# Patient Record
Sex: Male | Born: 1955 | Race: Black or African American | Hispanic: No | Marital: Single | Smoking: Current every day smoker
Health system: Southern US, Community
[De-identification: ages and names within clinical notes are randomized; demographics above are authoritative.]

## PROBLEM LIST (undated history)

## (undated) DIAGNOSIS — L409 Psoriasis, unspecified: Secondary | ICD-10-CM

## (undated) DIAGNOSIS — I10 Essential (primary) hypertension: Secondary | ICD-10-CM

## (undated) DIAGNOSIS — Z720 Tobacco use: Secondary | ICD-10-CM

## (undated) DIAGNOSIS — J449 Chronic obstructive pulmonary disease, unspecified: Secondary | ICD-10-CM

## (undated) DIAGNOSIS — G8929 Other chronic pain: Secondary | ICD-10-CM

## (undated) HISTORY — DX: Tobacco use: Z72.0

## (undated) HISTORY — DX: Other chronic pain: G89.29

## (undated) HISTORY — DX: Essential (primary) hypertension: I10

## (undated) HISTORY — DX: Chronic obstructive pulmonary disease, unspecified: J44.9

---

## 1998-01-17 ENCOUNTER — Encounter: Admission: RE | Admit: 1998-01-17 | Discharge: 1998-01-17 | Payer: Self-pay | Admitting: Family Medicine

## 1998-02-05 ENCOUNTER — Emergency Department (HOSPITAL_COMMUNITY): Admission: EM | Admit: 1998-02-05 | Discharge: 1998-02-05 | Payer: Self-pay | Admitting: Emergency Medicine

## 2004-02-01 ENCOUNTER — Emergency Department (HOSPITAL_COMMUNITY): Admission: EM | Admit: 2004-02-01 | Discharge: 2004-02-01 | Payer: Self-pay | Admitting: Family Medicine

## 2011-04-24 ENCOUNTER — Emergency Department (INDEPENDENT_AMBULATORY_CARE_PROVIDER_SITE_OTHER)
Admission: EM | Admit: 2011-04-24 | Discharge: 2011-04-24 | Disposition: A | Discharge status: Home / Self Care | Payer: Self-pay | Source: Home / Self Care | Attending: Family Medicine | Admitting: Family Medicine

## 2011-04-24 ENCOUNTER — Encounter (HOSPITAL_COMMUNITY): Payer: Self-pay | Admitting: Emergency Medicine

## 2011-04-24 DIAGNOSIS — K0510 Chronic gingivitis, plaque induced: Secondary | ICD-10-CM

## 2011-04-24 MED ORDER — DICLOFENAC POTASSIUM 50 MG PO TABS: 50.0000 mg | ORAL_TABLET | Freq: Three times a day (TID) | ORAL | Status: AC

## 2011-04-24 MED ORDER — CLINDAMYCIN HCL 150 MG PO CAPS: 150.0000 mg | ORAL_CAPSULE | Freq: Four times a day (QID) | ORAL | Status: AC

## 2011-04-24 NOTE — ED Provider Notes (Signed)
History     CSN: [id number]  Arrival date & time 04/24/11  4098   First MD Initiated Contact with Patient 04/24/11 0820      Chief Complaint  Patient presents with  . Dental Pain    (Consider location/radiation/quality/duration/timing/severity/associated sxs/prior treatment) Patient is a 56 y.o. male presenting with tooth pain. The history is provided by the patient.  Dental PainThe primary symptoms include mouth pain and dental injury. The symptoms began more than 1 week ago (worse last eve.). The symptoms are worsening. The symptoms occur constantly.  Additional symptoms include: gum swelling, gum tenderness and purulent gums.    History reviewed. No pertinent past medical history.  History reviewed. No pertinent past surgical history.  No family history on file.  History  Substance Use Topics  . Smoking status: Current Everyday Smoker  . Smokeless tobacco: Not on file  . Alcohol Use: No      Review of Systems  Constitutional: Negative.   HENT: Positive for dental problem.     Allergies  Review of patient's allergies indicates no known allergies.  Home Medications   Current Outpatient Rx  Name Route Sig Dispense Refill  . IBUPROFEN 200 MG PO TABS Oral Take 400 mg by mouth every 6 (six) hours as needed.    Marland Kitchen CLINDAMYCIN HCL 150 MG PO CAPS Oral Take 1 capsule (150 mg total) by mouth every 6 (six) hours. 28 capsule 0  . DICLOFENAC POTASSIUM 50 MG PO TABS Oral Take 1 tablet (50 mg total) by mouth 3 (three) times daily. 15 tablet 0    BP 150/70  Pulse 74  Temp(Src) 98.2 F (36.8 C) (Oral)  Resp 18  SpO2 99%  Physical Exam  Nursing note and vitals reviewed. Constitutional: He appears well-developed and well-nourished.  HENT:  Head: Normocephalic.  Right Ear: External ear normal.  Left Ear: External ear normal.  Mouth/Throat:      ED Course  Procedures (including critical care time)  Labs Reviewed - No data to display No results found.   1.  Gingivitis, chronic, plaque induced       MDM          Linna Hoff, MD 04/24/11 [phone]

## 2011-04-24 NOTE — Discharge Instructions (Signed)
Take medicine as prescribed, see your dentist as soon as possible °

## 2011-04-24 NOTE — ED Notes (Signed)
Reports top, left tooth is hurting, reports tooth is broken.  History of this tooth hurting in the past.

## 2014-08-16 ENCOUNTER — Emergency Department (HOSPITAL_COMMUNITY)
Admission: EM | Admit: 2014-08-16 | Discharge: 2014-08-16 | Disposition: A | Discharge status: Home / Self Care | Payer: Medicaid Other | Attending: Emergency Medicine | Admitting: Emergency Medicine

## 2014-08-16 ENCOUNTER — Encounter (HOSPITAL_COMMUNITY): Payer: Self-pay | Admitting: Emergency Medicine

## 2014-08-16 ENCOUNTER — Emergency Department (HOSPITAL_COMMUNITY): Payer: Medicaid Other

## 2014-08-16 DIAGNOSIS — Z720 Tobacco use: Secondary | ICD-10-CM | POA: Insufficient documentation

## 2014-08-16 DIAGNOSIS — Y9389 Activity, other specified: Secondary | ICD-10-CM | POA: Diagnosis not present

## 2014-08-16 DIAGNOSIS — S4991XA Unspecified injury of right shoulder and upper arm, initial encounter: Secondary | ICD-10-CM | POA: Insufficient documentation

## 2014-08-16 DIAGNOSIS — Y9289 Other specified places as the place of occurrence of the external cause: Secondary | ICD-10-CM | POA: Diagnosis not present

## 2014-08-16 DIAGNOSIS — S20211A Contusion of right front wall of thorax, initial encounter: Secondary | ICD-10-CM | POA: Diagnosis not present

## 2014-08-16 DIAGNOSIS — W208XXA Other cause of strike by thrown, projected or falling object, initial encounter: Secondary | ICD-10-CM | POA: Diagnosis not present

## 2014-08-16 DIAGNOSIS — Y998 Other external cause status: Secondary | ICD-10-CM | POA: Insufficient documentation

## 2014-08-16 DIAGNOSIS — S299XXA Unspecified injury of thorax, initial encounter: Secondary | ICD-10-CM | POA: Diagnosis present

## 2014-08-16 MED ORDER — TRAMADOL HCL 50 MG PO TABS: 50.0000 mg | ORAL_TABLET | Freq: Four times a day (QID) | ORAL | Status: DC | PRN

## 2014-08-16 MED ORDER — NAPROXEN 375 MG PO TABS: 375.0000 mg | ORAL_TABLET | Freq: Two times a day (BID) | ORAL | Status: DC

## 2014-08-16 NOTE — ED Notes (Signed)
Pt reports he was struck by tree limb two days ago when trying to put it in a wood chipper.  Pt c/o right arm and chest pain.

## 2014-08-16 NOTE — ED Provider Notes (Addendum)
CSN: [id number]     Arrival date & time 08/16/14  5621 History   First MD Initiated Contact with Patient 08/16/14 [phone]     Chief Complaint  Patient presents with  . Chest Pain    hit by a tree  . Arm Pain     (Consider location/radiation/quality/duration/timing/severity/associated sxs/prior Treatment) HPI Comments: Patient presents with right-sided chest pain and right shoulder pain. 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 He's had constant pain in his right chest and right shoulder since that time. He states it hurts to try to sit up or to move his arm. He denies any numbness or weakness in his arm. He denies any shortness of breath. He does say it hurts to breathe. He denies abdominal pain. There is no nausea or vomiting. He denies any other injuries. He's been using ibuprofen without relief.   History reviewed. No pertinent past medical history. History reviewed. No pertinent past surgical history. No family history on file. History  Substance Use Topics  . Smoking status: Current Every Day Smoker  . Smokeless tobacco: Not on file  . Alcohol Use: No    Review of Systems  Constitutional: Negative for fever.  Respiratory: Negative for shortness of breath.   Cardiovascular: Positive for chest pain.  Gastrointestinal: Negative for nausea and vomiting.  Musculoskeletal: Positive for arthralgias. Negative for back pain, joint swelling and neck pain.  Skin: Negative for wound.  Neurological: Negative for weakness, numbness and headaches.      Allergies  Review of patient's allergies indicates no known allergies.  Home Medications   Prior to Admission medications   Medication Sig Start Date End Date Taking? Authorizing Provider  ibuprofen (ADVIL,MOTRIN) 200 MG tablet Take 400 mg by mouth every 6 (six) hours as needed.    Historical Provider, MD  naproxen (NAPROSYN) 375 MG tablet Take 1 tablet  (375 mg total) by mouth 2 (two) times daily. 08/16/14   Rolan Bucco, MD  traMADol (ULTRAM) 50 MG tablet Take 1 tablet (50 mg total) by mouth every 6 (six) hours as needed. 08/16/14   Rolan Bucco, MD   BP 140/72 mmHg  Pulse 85  Temp(Src) 98.1 F (36.7 C) (Oral)  Resp 12  Ht  (1.626 m)  Wt 199 lb 1 oz (90.294 kg)  BMI 34.15 kg/m2  SpO2 99% Physical Exam  Constitutional: He is oriented to person, place, and time. He appears well-developed and well-nourished.  HENT:  Head: Normocephalic and atraumatic.  Neck: Normal range of motion. Neck supple.  Cardiovascular: Normal rate.   Pulmonary/Chest: Effort normal. He exhibits tenderness.  Positive tenderness in the right upper chest wall. There is no crepitus or deformity. No signs of external trauma  Musculoskeletal: He exhibits tenderness. He exhibits no edema.  Positive tenderness on range of motion of the right shoulder. There is tenderness to the anterior right shoulder. There is no pain to the elbow or the wrist. He has normal sensation and motor function in the hand. There some mild tenderness over the scapula as well.  Neurological: He is alert and oriented to person, place, and time.  Skin: Skin is warm and dry.  Psychiatric: He has a normal mood and affect.    ED Course  Procedures (including critical care time) Labs Review Labs Reviewed - No data to display  Imaging Review Dg Chest 2 View  08/16/2014   CLINICAL DATA:  Throat by tree limb 2 days ago with persistent right arm and chest pain predominantly right upper chest  EXAM: CHEST  2 VIEW  COMPARISON:  None.  FINDINGS: The lungs are reasonably well inflated. There is no pneumothorax or pleural effusion. Minimal subsegmental atelectasis at the right lung base posteriorly is present. The heart is normal in size. The pulmonary vascularity is not engorged. The mediastinum is normal in width. The observed bony thorax exhibits no acute abnormality. There is mild degenerative  disc space narrowing at multiple mid and lower thoracic levels with mild anterior wedging of 2 vertebral bodies that is likely chronic.  IMPRESSION: Minimal right lower lobe subsegmental atelectasis. There is no pneumothorax, pleural effusion, or pulmonary contusion. No acute fracture of the bony thorax is observed.   Electronically Signed   By: David  SwazilandJordan M.D.   On: 08/16/2014 08:06   Dg Shoulder Right  08/16/2014   CLINICAL DATA:  Struck by tree limb while chipping wood, right shoulder pain, initial encounter  EXAM: RIGHT SHOULDER - 2+ VIEW  COMPARISON:  None.  FINDINGS: There is no evidence of fracture or dislocation. There is no evidence of arthropathy or other focal bone abnormality. Soft tissues are unremarkable.  IMPRESSION: No acute abnormality noted.   Electronically Signed   By: Alcide CleverMark  Lukens M.D.   On: 08/16/2014 08:05     EKG Interpretation   Date/Time:  Monday August 16 2014 07:17:50 EDT Ventricular Rate:  93 PR Interval:  138 QRS Duration: 74 QT Interval:  370 QTC Calculation: 460 R Axis:   5 Text Interpretation:  Sinus rhythm with Fusion complexes Nonspecific T  wave abnormality Prolonged QT Abnormal ECG No old tracing to compare  Confirmed by Janessa Mickle  MD, Tamber Burtch ([id number]) on 08/16/2014 8:32:28 AM      MDM   Final diagnoses:  Chest wall contusion, right, initial encounter    There is no fractures identified. No pneumothorax. Patient was discharged home in good condition. He was given an incentive spirometer to use. He is given a prescription for tramadol and Naprosyn. He was encouraged to follow-up with a primary care physician if his symptoms are not improving or return here as needed for any worsening symptoms.    Rolan BuccoMelanie Tonianne Fine, MD 08/16/14 [id number]  Rolan BuccoMelanie Jakeia Carreras, MD 08/16/14 [phone]

## 2014-08-16 NOTE — Discharge Instructions (Signed)

## 2014-08-16 NOTE — ED Notes (Signed)
Patient transported to x-ray. ?

## 2014-08-16 NOTE — ED Notes (Signed)
Pt. 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  Pain on the right side and my right side.

## 2021-01-07 ENCOUNTER — Other Ambulatory Visit: Payer: Self-pay

## 2021-01-07 ENCOUNTER — Inpatient Hospital Stay (HOSPITAL_COMMUNITY)
Admission: EM | Admit: 2021-01-07 | Discharge: 2021-01-09 | DRG: 193 | Disposition: A | Discharge status: Home / Self Care | Payer: BC Managed Care – PPO | Attending: Family Medicine | Admitting: Family Medicine

## 2021-01-07 ENCOUNTER — Inpatient Hospital Stay (HOSPITAL_COMMUNITY): Payer: BC Managed Care – PPO

## 2021-01-07 ENCOUNTER — Emergency Department (HOSPITAL_COMMUNITY): Payer: BC Managed Care – PPO

## 2021-01-07 ENCOUNTER — Encounter (HOSPITAL_COMMUNITY): Payer: Self-pay

## 2021-01-07 DIAGNOSIS — M7989 Other specified soft tissue disorders: Secondary | ICD-10-CM | POA: Diagnosis present

## 2021-01-07 DIAGNOSIS — Z20822 Contact with and (suspected) exposure to covid-19: Secondary | ICD-10-CM | POA: Diagnosis present

## 2021-01-07 DIAGNOSIS — L409 Psoriasis, unspecified: Secondary | ICD-10-CM | POA: Diagnosis present

## 2021-01-07 DIAGNOSIS — E876 Hypokalemia: Secondary | ICD-10-CM | POA: Diagnosis present

## 2021-01-07 DIAGNOSIS — M25512 Pain in left shoulder: Secondary | ICD-10-CM | POA: Diagnosis present

## 2021-01-07 DIAGNOSIS — J189 Pneumonia, unspecified organism: Secondary | ICD-10-CM | POA: Diagnosis present

## 2021-01-07 DIAGNOSIS — D72829 Elevated white blood cell count, unspecified: Secondary | ICD-10-CM | POA: Diagnosis present

## 2021-01-07 DIAGNOSIS — K7460 Unspecified cirrhosis of liver: Secondary | ICD-10-CM | POA: Diagnosis present

## 2021-01-07 DIAGNOSIS — M25532 Pain in left wrist: Secondary | ICD-10-CM | POA: Diagnosis present

## 2021-01-07 DIAGNOSIS — D75839 Thrombocytosis, unspecified: Secondary | ICD-10-CM | POA: Diagnosis present

## 2021-01-07 DIAGNOSIS — J441 Chronic obstructive pulmonary disease with (acute) exacerbation: Secondary | ICD-10-CM | POA: Diagnosis present

## 2021-01-07 DIAGNOSIS — F17200 Nicotine dependence, unspecified, uncomplicated: Secondary | ICD-10-CM | POA: Diagnosis present

## 2021-01-07 DIAGNOSIS — T380X5A Adverse effect of glucocorticoids and synthetic analogues, initial encounter: Secondary | ICD-10-CM | POA: Diagnosis not present

## 2021-01-07 DIAGNOSIS — J440 Chronic obstructive pulmonary disease with acute lower respiratory infection: Secondary | ICD-10-CM | POA: Diagnosis present

## 2021-01-07 DIAGNOSIS — Z720 Tobacco use: Secondary | ICD-10-CM

## 2021-01-07 DIAGNOSIS — Z8709 Personal history of other diseases of the respiratory system: Secondary | ICD-10-CM

## 2021-01-07 DIAGNOSIS — R52 Pain, unspecified: Secondary | ICD-10-CM

## 2021-01-07 DIAGNOSIS — M79642 Pain in left hand: Secondary | ICD-10-CM

## 2021-01-07 DIAGNOSIS — J9601 Acute respiratory failure with hypoxia: Secondary | ICD-10-CM | POA: Diagnosis present

## 2021-01-07 DIAGNOSIS — R739 Hyperglycemia, unspecified: Secondary | ICD-10-CM | POA: Diagnosis present

## 2021-01-07 DIAGNOSIS — F17210 Nicotine dependence, cigarettes, uncomplicated: Secondary | ICD-10-CM | POA: Diagnosis present

## 2021-01-07 HISTORY — DX: Psoriasis, unspecified: L40.9

## 2021-01-07 LAB — BASIC METABOLIC PANEL
Anion gap: 12 (ref 5–15)
BUN: 12 mg/dL (ref 8–23)
CO2: 25 mmol/L (ref 22–32)
Calcium: 8.6 mg/dL — ABNORMAL LOW (ref 8.9–10.3)
Chloride: 99 mmol/L (ref 98–111)
Creatinine, Ser: 0.95 mg/dL (ref 0.61–1.24)
GFR, Estimated: >60 mL/min (ref >60)
Glucose, Bld: 126 mg/dL — ABNORMAL HIGH (ref 70–99)
Potassium: 3.2 mmol/L — ABNORMAL LOW (ref 3.5–5.1)
Sodium: 136 mmol/L (ref 135–145)

## 2021-01-07 LAB — CBC WITH DIFFERENTIAL/PLATELET
Abs Immature Granulocytes: 0.32 10*3/uL — ABNORMAL HIGH (ref 0.00–0.07)
Basophils Absolute: 0 10*3/uL (ref 0.0–0.1)
Basophils Relative: 0 %
Eosinophils Absolute: 0 10*3/uL (ref 0.0–0.5)
Eosinophils Relative: 0 %
HCT: 38.3 % — ABNORMAL LOW (ref 39.0–52.0)
Hemoglobin: 12.5 g/dL — ABNORMAL LOW (ref 13.0–17.0)
Immature Granulocytes: 2 %
Lymphocytes Relative: 10 %
Lymphs Abs: 1.9 10*3/uL (ref 0.7–4.0)
MCH: 26.3 pg (ref 26.0–34.0)
MCHC: 32.6 g/dL (ref 30.0–36.0)
MCV: 80.6 fL (ref 80.0–100.0)
Monocytes Absolute: 1.7 10*3/uL — ABNORMAL HIGH (ref 0.1–1.0)
Monocytes Relative: 9 %
Neutro Abs: 15.7 10*3/uL — ABNORMAL HIGH (ref 1.7–7.7)
Neutrophils Relative %: 79 %
Platelets: 553 10*3/uL — ABNORMAL HIGH (ref 150–400)
RBC: 4.75 MIL/uL (ref 4.22–5.81)
RDW: 14.7 % (ref 11.5–15.5)
WBC: 19.7 10*3/uL — ABNORMAL HIGH (ref 4.0–10.5)
nRBC: 0 % (ref 0.0–0.2)

## 2021-01-07 LAB — PROCALCITONIN: Procalcitonin: <0.1 ng/mL

## 2021-01-07 LAB — MAGNESIUM: Magnesium: 1.8 mg/dL (ref 1.7–2.4)

## 2021-01-07 LAB — RESP PANEL BY RT-PCR (FLU A&B, COVID) ARPGX2
Influenza A by PCR: NEGATIVE
Influenza B by PCR: NEGATIVE
SARS Coronavirus 2 by RT PCR: NEGATIVE

## 2021-01-07 LAB — HIV ANTIBODY (ROUTINE TESTING W REFLEX): HIV Screen 4th Generation wRfx: NONREACTIVE

## 2021-01-07 LAB — PHOSPHORUS: Phosphorus: 2.6 mg/dL (ref 2.5–4.6)

## 2021-01-07 LAB — BRAIN NATRIURETIC PEPTIDE: B Natriuretic Peptide: 57 pg/mL (ref 0.0–100.0)

## 2021-01-07 MED ORDER — NICOTINE 21 MG/24HR TD PT24
21.0000 mg | MEDICATED_PATCH | Freq: Every day | TRANSDERMAL | Status: DC
  Filled 2021-01-07 (×2): qty 1

## 2021-01-07 MED ORDER — SODIUM CHLORIDE 0.9 % IV SOLN
1.0000 g | INTRAVENOUS | Status: DC
  Administered 2021-01-08: 05:00:00 1 g via INTRAVENOUS
  Filled 2021-01-07: qty 10

## 2021-01-07 MED ORDER — ACETAMINOPHEN 325 MG PO TABS
650.0000 mg | ORAL_TABLET | Freq: Four times a day (QID) | ORAL | Status: DC | PRN
  Administered 2021-01-08 – 2021-01-09 (×2): 650 mg via ORAL
  Filled 2021-01-07 (×2): qty 2

## 2021-01-07 MED ORDER — PANTOPRAZOLE SODIUM 40 MG PO TBEC
40.0000 mg | DELAYED_RELEASE_TABLET | Freq: Every day | ORAL | Status: DC
  Administered 2021-01-07 – 2021-01-09 (×3): 40 mg via ORAL
  Filled 2021-01-07 (×3): qty 1

## 2021-01-07 MED ORDER — METHYLPREDNISOLONE SODIUM SUCC 40 MG IJ SOLR
40.0000 mg | Freq: Two times a day (BID) | INTRAMUSCULAR | Status: DC
  Administered 2021-01-07 – 2021-01-09 (×4): 40 mg via INTRAVENOUS
  Filled 2021-01-07 (×4): qty 1

## 2021-01-07 MED ORDER — IPRATROPIUM-ALBUTEROL 0.5-2.5 (3) MG/3ML IN SOLN: 3.0000 mL | RESPIRATORY_TRACT | Status: DC | PRN

## 2021-01-07 MED ORDER — ENOXAPARIN SODIUM 40 MG/0.4ML IJ SOSY
40.0000 mg | PREFILLED_SYRINGE | INTRAMUSCULAR | Status: DC
  Filled 2021-01-07 (×2): qty 0.4

## 2021-01-07 MED ORDER — DM-GUAIFENESIN ER 30-600 MG PO TB12
1.0000 | ORAL_TABLET | Freq: Two times a day (BID) | ORAL | Status: DC
  Administered 2021-01-07 – 2021-01-09 (×5): 1 via ORAL
  Filled 2021-01-07 (×5): qty 1

## 2021-01-07 MED ORDER — POTASSIUM CHLORIDE CRYS ER 20 MEQ PO TBCR
40.0000 meq | EXTENDED_RELEASE_TABLET | Freq: Once | ORAL | Status: AC
  Administered 2021-01-07: 40 meq via ORAL
  Filled 2021-01-07: qty 2

## 2021-01-07 MED ORDER — INDOMETHACIN ER 75 MG PO CPCR: 75.0000 mg | ORAL_CAPSULE | Freq: Two times a day (BID) | ORAL | Status: DC

## 2021-01-07 MED ORDER — AZITHROMYCIN 250 MG PO TABS
500.0000 mg | ORAL_TABLET | Freq: Once | ORAL | Status: AC
  Administered 2021-01-07: 500 mg via ORAL
  Filled 2021-01-07: qty 2

## 2021-01-07 MED ORDER — OXYCODONE HCL 5 MG PO TABS
5.0000 mg | ORAL_TABLET | ORAL | Status: DC | PRN
  Administered 2021-01-07 – 2021-01-09 (×5): 5 mg via ORAL
  Filled 2021-01-07 (×5): qty 1

## 2021-01-07 MED ORDER — PREDNISONE 50 MG PO TABS
60.0000 mg | ORAL_TABLET | Freq: Once | ORAL | Status: AC
  Administered 2021-01-07: 60 mg via ORAL
  Filled 2021-01-07: qty 1

## 2021-01-07 MED ORDER — SODIUM CHLORIDE 0.9 % IV SOLN
500.0000 mg | INTRAVENOUS | Status: DC
  Administered 2021-01-08 – 2021-01-09 (×2): 500 mg via INTRAVENOUS
  Filled 2021-01-07 (×3): qty 500

## 2021-01-07 MED ORDER — IPRATROPIUM BROMIDE 0.02 % IN SOLN
0.5000 mg | Freq: Once | RESPIRATORY_TRACT | Status: AC
  Administered 2021-01-07: 0.5 mg via RESPIRATORY_TRACT
  Filled 2021-01-07: qty 2.5

## 2021-01-07 MED ORDER — ALBUTEROL SULFATE (2.5 MG/3ML) 0.083% IN NEBU
5.0000 mg | INHALATION_SOLUTION | Freq: Once | RESPIRATORY_TRACT | Status: AC
  Administered 2021-01-07: 5 mg via RESPIRATORY_TRACT
  Filled 2021-01-07: qty 6

## 2021-01-07 MED ORDER — IPRATROPIUM-ALBUTEROL 0.5-2.5 (3) MG/3ML IN SOLN
3.0000 mL | Freq: Four times a day (QID) | RESPIRATORY_TRACT | Status: DC
  Administered 2021-01-07 – 2021-01-08 (×6): 3 mL via RESPIRATORY_TRACT
  Filled 2021-01-07 (×7): qty 3

## 2021-01-07 MED ORDER — ALBUTEROL SULFATE (2.5 MG/3ML) 0.083% IN NEBU
INHALATION_SOLUTION | RESPIRATORY_TRACT | Status: AC
  Administered 2021-01-07: 10 mg
  Filled 2021-01-07: qty 12

## 2021-01-07 MED ORDER — ALBUTEROL (5 MG/ML) CONTINUOUS INHALATION SOLN
10.0000 mg/h | INHALATION_SOLUTION | Freq: Once | RESPIRATORY_TRACT | Status: DC
  Filled 2021-01-07: qty 20

## 2021-01-07 MED ORDER — GUAIFENESIN-DM 100-10 MG/5ML PO SYRP
5.0000 mL | ORAL_SOLUTION | ORAL | Status: DC | PRN
  Administered 2021-01-08 – 2021-01-09 (×2): 5 mL via ORAL
  Filled 2021-01-07 (×2): qty 5

## 2021-01-07 MED ORDER — SODIUM CHLORIDE 0.9 % IV SOLN
1.0000 g | Freq: Once | INTRAVENOUS | Status: AC
  Administered 2021-01-07: 1 g via INTRAVENOUS
  Filled 2021-01-07: qty 10

## 2021-01-07 NOTE — ED Notes (Signed)
ED Provider at bedside. 

## 2021-01-07 NOTE — Progress Notes (Addendum)
Patient seen and evaluated, chart reviewed, please see EMR for updated orders. Please see full H&P dictated by admitting physician Dr Josephine Cables for same date of service.    Brief Summary:- 65 y.o. male significant medical history of psoriasis, tobacco admitted on 01/07/2021 with acute hypoxic respiratory failure secondary to left-sided community-acquired pneumonia  A/p 1) acute hypoxic respiratory failure--- due to left-sided community-acquired pneumonia -BNP is 57 -Currently on 4 L of oxygen via nasal cannula -Continue Rocephin/azithromycin along with bronchodilators and mucolytics -There is concern for parapneumonic effusion -Radiologist recommends CT chest with contrast  2)Lt Hand/Wrist swelling and pain/Pain----ultrasound/Doppler without DVT x-rays without acute fracture or foreign body -Is query inflammatory--check ESR and uric acid levels -Currently on Solu-Medrol for COPD, this should help if this is inflammatory in nature  3) tobacco abuse/COPD exacerbation----nicotine patch and bronchodilators as ordered, and Solu-Medrol  Patient seen and evaluated, chart reviewed, please see EMR for updated orders. Please see full H&P dictated by admitting physician Dr Josephine Cables for same date of service.   Total care time 43 minutes Roxan Hockey, MD

## 2021-01-07 NOTE — ED Triage Notes (Signed)
Pt arrived via [facility] c/o difficulty breathing that began last week Tuesday. Pt reports not following up with a PCP for many years and is unaware of any medical conditions he may have. Pt has cough, left arm, shoulder pain and swelling in left hand. EDP at bedside in Triage.

## 2021-01-07 NOTE — ED Provider Notes (Signed)
[facility] EMERGENCY DEPARTMENT Provider Note   CSN: [id number] Arrival date & time: 01/07/21  0453     History Chief Complaint  Patient presents with   Shortness of Breath    Jeffrey Krause is a 65 y.o. male.  The history is provided by the patient.  Shortness of Breath Severity:  Moderate Onset quality:  Gradual Duration:  1 week Timing:  Constant Progression:  Worsening Chronicity:  New Relieved by:  Nothing Worsened by:  Activity Associated symptoms: cough   Associated symptoms: no chest pain and no hemoptysis   Patient with history of psoriasis presents with cough and shortness of breath.  Patient reports for at least a week he has had increasing cough and shortness of breath.  He reports phlegm production without hemoptysis No active chest pain.  No fevers.  He also reports over the past day he has had left arm swelling of unclear etiology.  He denies any trauma/fall Patient reports distant history of asthma as a child but does not have any known chronic lung disease Patient is a daily smoker    Past Medical History:  Diagnosis Date   Psoriasis      Social History   Tobacco Use   Smoking status: Every Day  Substance Use Topics   Alcohol use: No   Drug use: No    Home Medications Prior to Admission medications   Medication Sig Start Date End Date Taking? Authorizing Provider  ibuprofen (ADVIL,MOTRIN) 200 MG tablet Take 400 mg by mouth every 6 (six) hours as needed.    [provider]  naproxen (NAPROSYN) 375 MG tablet Take 1 tablet (375 mg total) by mouth 2 (two) times daily. 08/16/14   Rolan Bucco, MD  traMADol (ULTRAM) 50 MG tablet Take 1 tablet (50 mg total) by mouth every 6 (six) hours as needed. 08/16/14   Rolan Bucco, MD    Allergies    Patient has no known allergies.  Review of Systems   Review of Systems  Respiratory:  Positive for cough and shortness of breath. Negative for hemoptysis.   Cardiovascular:  Negative for chest  pain and leg swelling.       Left arm swelling  All other systems reviewed and are negative.  Physical Exam Updated Vital Signs BP (!) 152/73 (BP Location: Left Arm)   Pulse 93   Resp 20   Ht 1.626 m (5\' 4" )   Wt 83.9 kg   SpO2 96%   BMI 31.76 kg/m   Physical Exam CONSTITUTIONAL: Chronically ill-appearing, appears older than stated age HEAD: Normocephalic/atraumatic EYES: EOMI/PERRL ENMT: Mucous membranes moist, no facial swelling NECK: supple no meningeal signs SPINE/BACK:entire spine nontender CV: S1/S2 noted, no murmurs/rubs/gallops noted LUNGS: Coarse wheezing bilaterally, tachypnea noted, coughs frequently during exam ABDOMEN: soft, nontender, no rebound or guarding, bowel sounds noted throughout abdomen GU:no cva tenderness NEURO: Pt is awake/alert/appropriate, moves all extremitiesx4.  No facial droop.   EXTREMITIES: pulses normal/equal, full ROM Pulses equal in both arms.  There is diffuse edema to left upper extremity, mostly at left shoulder, left elbow and left wrist.  No obvious deformities.  Diffuse tenderness is noted.  No erythema. SKIN: warm, color normal PSYCH: Mildly anxious  ED Results / Procedures / Treatments   Labs (all labs ordered are listed, but only abnormal results are displayed) Labs Reviewed  BASIC METABOLIC PANEL - Abnormal; Notable for the following components:      Result Value   Potassium 3.2 (*)    Glucose,  Bld 126 (*)    Calcium 8.6 (*)    All other components within normal limits  CBC WITH DIFFERENTIAL/PLATELET - Abnormal; Notable for the following components:   WBC 19.7 (*)    Hemoglobin 12.5 (*)    HCT 38.3 (*)    Platelets 553 (*)    Neutro Abs 15.7 (*)    Monocytes Absolute 1.7 (*)    Abs Immature Granulocytes 0.32 (*)    All other components within normal limits  RESP PANEL BY RT-PCR (FLU A&B, COVID) ARPGX2  BRAIN NATRIURETIC PEPTIDE    EKG EKG Interpretation  Date/Time:  Saturday January 07 2021 05:13:59  EST Ventricular Rate:  89 PR Interval:  131 QRS Duration: 90 QT Interval:  374 QTC Calculation: 456 R Axis:   44 Text Interpretation: Sinus rhythm Borderline T wave abnormalities Interpretation limited secondary to artifact Confirmed by Zadie Rhine ([id number]) on 01/07/2021 6:17:00 AM  Radiology DG Chest Port 1 View  Result Date: 01/07/2021 CLINICAL DATA:  Difficulty breathing.  Left shoulder and arm pain. EXAM: PORTABLE CHEST 1 VIEW COMPARISON:  PA Lat 08/16/2014. FINDINGS: There is patchy consolidation in the base of the left lung most likely due to a consolidated pneumonia, with a small left pleural effusion which is most likely parapneumonic. There is no right pleural effusion or pneumothorax and the remaining lungs are clear. The cardiac size is normal. Slight calcification aortic arch. Thoracic spondylosis. IMPRESSION: Patchy consolidation extending across the left base. Probable pneumonia with small parapneumonic effusion. Clinical correlation and radiographic follow-up recommended. If pneumonia is not clinically suspected then a chest CT would be indicated, Preferably with contrast. Electronically Signed   By: Almira Bar M.D.   On: 01/07/2021 05:52    Procedures Procedures   Medications Ordered in ED Medications  cefTRIAXone (ROCEPHIN) 1 g in sodium chloride 0.9 % 100 mL IVPB (1 g Intravenous New Bag/Given 01/07/21 0623)  albuterol (PROVENTIL,VENTOLIN) solution continuous neb ( Nebulization Canceled Entry 01/07/21 0641)  albuterol (PROVENTIL) (2.5 MG/3ML) 0.083% nebulizer solution 5 mg (5 mg Nebulization Given 01/07/21 0606)  ipratropium (ATROVENT) nebulizer solution 0.5 mg (0.5 mg Nebulization Given 01/07/21 0606)  predniSONE (DELTASONE) tablet 60 mg (60 mg Oral Given 01/07/21 0550)  azithromycin (ZITHROMAX) tablet 500 mg (500 mg Oral Given 01/07/21 0623)  albuterol (PROVENTIL) (2.5 MG/3ML) 0.083% nebulizer solution (10 mg  Given 01/07/21 5102)    ED Course  I have reviewed the  triage vital signs and the nursing notes.  Pertinent labs & imaging results that were available during my care of the patient were reviewed by me and considered in my medical decision making (see chart for details).    MDM Rules/Calculators/A&P                           Patient presents with cough and wheezing.  Will give nebulized therapies.  X-ray and labs have been ordered. Patient also reports left arm pain and swelling that is atraumatic.  No known history of DVT.  However patient does not appear to get consistent follow-up with a physician.  This could represent DVT, the patient reports due to job requirements he is on his arms and may be overusing his joint Advised patient he will need to be admitted 6:11 AM X-ray consistent with pneumonia possible parapneumonic effusion.  Given his overall respiratory status, lack of primary care, he will need to be admitted for further treatment 6:46 AM Patient agrees to be admitted.  He  still has wheezing bilaterally and is tachypneic.  He does not have an oxygen requirement.  Strong suspicion this patient has undiagnosed COPD.  Will admit for continued nebulized therapy and IV antibiotics.  Discussed with Dr. Thomes Dinning for admission Final Clinical Impression(s) / ED Diagnoses Final diagnoses:  Community acquired pneumonia of left lower lobe of lung    Rx / DC Orders ED Discharge Orders     None        Zadie Rhine, MD 01/07/21 [phone]

## 2021-01-07 NOTE — ED Provider Notes (Signed)
.  Critical Care Performed by: Zadie Rhine, MD Authorized by: Zadie Rhine, MD   Critical care provider statement:    Critical care time (minutes):  60   Critical care start time:  01/07/2021 5:20 AM   Critical care end time:  01/07/2021 6:20 AM   Critical care time was exclusive of:  Separately billable procedures and treating other patients   Critical care was necessary to treat or prevent imminent or life-threatening deterioration of the following conditions:  Respiratory failure and sepsis   Critical care was time spent personally by me on the following activities:  Development of treatment plan with patient or surrogate, obtaining history from patient or surrogate, examination of patient, ordering and review of radiographic studies, ordering and review of laboratory studies, ordering and performing treatments and interventions, pulse oximetry, re-evaluation of patient's condition and evaluation of patient's response to treatment   I assumed direction of critical care for this patient from another provider in my specialty: no     Care discussed with: admitting provider   Patient required reassessments, multiple neb therapies, and IV antibiotics.   Zadie Rhine, MD 01/07/21 [phone]

## 2021-01-07 NOTE — H&P (Signed)
History and Physical  Jeffrey Krause [id number] DOB: 05-30-1955 DOA: 01/07/2021  Referring physician: Ripley Fraise, MD PCP: Pcp, No  Patient coming from: Home  Chief Complaint: Shortness of breath  HPI: Jeffrey Krause is a 65 y.o. male significant medical history of psoriasis, tobacco use who presents to the emergency department due to 1 week onset of chest congestion, cough with production of yellow phlegm, generalized weakness, body aches and shortness of breath which worsened within last 24 hours so he presented to the ED for further evaluation and management.  Patient endorsed history of asthma as a child, but denies any lung disease.  Patient does not follow-up with any physician and he has several years of tobacco use.  He denies chest pain, fever, headache, nausea, vomiting or abdominal pain.  ED Course:  In the emergency department, he was hemodynamically stable.  Work-up in the ED showed leukocytosis and thrombocytosis, BMP showed hypokalemia and mild hyperglycemia.  BNP was 57 Chest x-ray was suggestive of pneumonia at the left base Patient was started on IV antibiotics (ceftriaxone and azithromycin).  Breathing treatment was provided, hospitalist was asked to admit patient for further evaluation and management.  Review of Systems: Constitutional: Negative for chills and fever.  HENT: Negative for ear pain and sore throat.   Eyes: Negative for pain and visual disturbance.  Respiratory: Positive for cough and shortness of breath.   Cardiovascular: Negative for chest pain and palpitations.  Gastrointestinal: Negative for abdominal pain and vomiting.  Endocrine: Negative for polyphagia and polyuria.  Genitourinary: Negative for decreased urine volume, dysuria, enuresis Musculoskeletal: Positive for left arm swelling. Negative for arthralgias and back pain.  Skin: Negative for color change and rash.  Allergic/Immunologic: Negative for immunocompromised state.   Neurological: Negative for tremors, syncope, speech difficulty, weakness, light-headedness and headaches.  Hematological: Does not bruise/bleed easily.  All other systems reviewed and are negative   Past Medical History:  Diagnosis Date   Psoriasis    No past surgical history on file.  Social History:  reports that he has been smoking. He does not have any smokeless tobacco history on file. He reports that he does not drink alcohol and does not use drugs.   No Known Allergies  Family history: Patient denies any known family history  Prior to Admission medications   Medication Sig Start Date End Date Taking? Authorizing Provider  ibuprofen (ADVIL,MOTRIN) 200 MG tablet Take 400 mg by mouth every 6 (six) hours as needed.    [provider]  naproxen (NAPROSYN) 375 MG tablet Take 1 tablet (375 mg total) by mouth 2 (two) times daily. 08/16/14   Malvin Johns, MD  traMADol (ULTRAM) 50 MG tablet Take 1 tablet (50 mg total) by mouth every 6 (six) hours as needed. 08/16/14   Malvin Johns, MD    Physical Exam: BP 132/84   Pulse 89   Temp 99 F (37.2 C) (Oral)   Resp 20   Ht 5\' 4"  (1.626 m)   Wt 83.9 kg   SpO2 99%   BMI 31.76 kg/m   General: 65 y.o. year-old male well developed well nourished in no acute distress.  Alert and oriented x3. HEENT: NCAT, EOMI Neck: Supple, trachea medial Cardiovascular: Regular rate and rhythm with no rubs or gallops.  No thyromegaly or JVD noted.  No lower extremity edema. 2/4 pulses in all 4 extremities. Respiratory: Bilateral diffuse wheezing with frequent cough during exam.   Abdomen: Soft, nontender nondistended with normal bowel sounds x4 quadrants.  Muskuloskeletal: Noted swelling of left antecubital area.  No cyanosis or clubbing noted bilaterally Neuro: CN II-XII intact, strength 5/5 x 4, sensation, reflexes intact Skin: Psoriatic rashes noted in different parts of the body.  No ulcerative lesions noted  Psychiatry: Judgement and  insight appear normal. Mood is appropriate for condition and setting          Labs on Admission:  Basic Metabolic Panel: Recent Labs  Lab 01/07/21 0522  NA 136  K 3.2*  CL 99  CO2 25  GLUCOSE 126*  BUN 12  CREATININE 0.95  CALCIUM 8.6*   Liver Function Tests: No results for input(s): AST, ALT, ALKPHOS, BILITOT, PROT, ALBUMIN in the last 168 hours. No results for input(s): LIPASE, AMYLASE in the last 168 hours. No results for input(s): AMMONIA in the last 168 hours. CBC: Recent Labs  Lab 01/07/21 0522  WBC 19.7*  NEUTROABS 15.7*  HGB 12.5*  HCT 38.3*  MCV 80.6  PLT 553*   Cardiac Enzymes: No results for input(s): CKTOTAL, CKMB, CKMBINDEX, TROPONINI in the last 168 hours.  BNP (last 3 results) Recent Labs    01/07/21 0522  BNP 57.0    ProBNP (last 3 results) No results for input(s): PROBNP in the last 8760 hours.  CBG: No results for input(s): GLUCAP in the last 168 hours.  Radiological Exams on Admission: DG Chest Port 1 View  Result Date: 01/07/2021 CLINICAL DATA:  Difficulty breathing.  Left shoulder and arm pain. EXAM: PORTABLE CHEST 1 VIEW COMPARISON:  PA Lat 08/16/2014. FINDINGS: There is patchy consolidation in the base of the left lung most likely due to a consolidated pneumonia, with a small left pleural effusion which is most likely parapneumonic. There is no right pleural effusion or pneumothorax and the remaining lungs are clear. The cardiac size is normal. Slight calcification aortic arch. Thoracic spondylosis. IMPRESSION: Patchy consolidation extending across the left base. Probable pneumonia with small parapneumonic effusion. Clinical correlation and radiographic follow-up recommended. If pneumonia is not clinically suspected then a chest CT would be indicated, Preferably with contrast. Electronically Signed   By: Almira Bar M.D.   On: 01/07/2021 05:52    EKG: I independently viewed the EKG done and my findings are as followed: Normal sinus  rhythm at a rate of 89 bpm  Assessment/Plan Present on Admission:  CAP (community acquired pneumonia)  Principal Problem:   CAP (community acquired pneumonia) Active Problems:   Hypokalemia   Left arm swelling   Leukocytosis   Thrombocytosis   Psoriasis   Tobacco use  Community acquired pneumonia POA superimposed with possible acute bronchitis vs undiagnosed COPD Chest x-ray was suggestive of pneumonia Continue Mucinex, ceftriaxone, azithromycin, duo nebs, Solu-Medrol Continue  incentive spirometry, flutter valve  Continue Protonix to prevent steroid-induced ulcer Blood culture, sputum culture, urine Legionella, strep pneumo and procalcitonin pending  Leukocytosis possibly secondary to above versus reactive WBC 19.7, continue management as described above  Left arm swelling Ultrasound of left upper extremity will be done to rule out DVT  Thrombocytosis possibly reactive Stable, continue to monitor platelet levels with morning labs  Psoriasis Stable, patient was not on any medication  Tobacco use Patient counseled on tobacco use cessation Nicotine patch will be provided  DVT prophylaxis: Lovenox  Code Status: Full code  Family Communication: None at bedside  Disposition Plan:  Patient is from:                        home Anticipated  DC to:                   SNF or family members home Anticipated DC date:               2-3 days Anticipated DC barriers:         Patient requires inpatient management due to increased work of breathing in the setting of community-acquired pneumonia requiring IV antibiotics  Consults called: None  Admission status: Inpatient    Bernadette Hoit MD [facility]  01/07/2021, 7:11 AM

## 2021-01-08 ENCOUNTER — Inpatient Hospital Stay (HOSPITAL_COMMUNITY): Payer: BC Managed Care – PPO

## 2021-01-08 DIAGNOSIS — M7989 Other specified soft tissue disorders: Secondary | ICD-10-CM | POA: Diagnosis not present

## 2021-01-08 DIAGNOSIS — J189 Pneumonia, unspecified organism: Secondary | ICD-10-CM | POA: Diagnosis not present

## 2021-01-08 DIAGNOSIS — D75839 Thrombocytosis, unspecified: Secondary | ICD-10-CM | POA: Diagnosis not present

## 2021-01-08 DIAGNOSIS — D72829 Elevated white blood cell count, unspecified: Secondary | ICD-10-CM | POA: Diagnosis not present

## 2021-01-08 LAB — CBC
HCT: 34.7 % — ABNORMAL LOW (ref 39.0–52.0)
Hemoglobin: 11.4 g/dL — ABNORMAL LOW (ref 13.0–17.0)
MCH: 26.8 pg (ref 26.0–34.0)
MCHC: 32.9 g/dL (ref 30.0–36.0)
MCV: 81.5 fL (ref 80.0–100.0)
Platelets: 549 10*3/uL — ABNORMAL HIGH (ref 150–400)
RBC: 4.26 MIL/uL (ref 4.22–5.81)
RDW: 15.3 % (ref 11.5–15.5)
WBC: 22.9 10*3/uL — ABNORMAL HIGH (ref 4.0–10.5)
nRBC: 0 % (ref 0.0–0.2)

## 2021-01-08 LAB — RENAL FUNCTION PANEL
Albumin: 2.4 g/dL — ABNORMAL LOW (ref 3.5–5.0)
Anion gap: 7 (ref 5–15)
BUN: 19 mg/dL (ref 8–23)
CO2: 26 mmol/L (ref 22–32)
Calcium: 8.8 mg/dL — ABNORMAL LOW (ref 8.9–10.3)
Chloride: 105 mmol/L (ref 98–111)
Creatinine, Ser: 0.89 mg/dL (ref 0.61–1.24)
GFR, Estimated: >60 mL/min (ref >60)
Glucose, Bld: 158 mg/dL — ABNORMAL HIGH (ref 70–99)
Phosphorus: 3.5 mg/dL (ref 2.5–4.6)
Potassium: 4.4 mmol/L (ref 3.5–5.1)
Sodium: 138 mmol/L (ref 135–145)

## 2021-01-08 LAB — URIC ACID: Uric Acid, Serum: 7.4 mg/dL (ref 3.7–8.6)

## 2021-01-08 LAB — STREP PNEUMONIAE URINARY ANTIGEN: Strep Pneumo Urinary Antigen: NEGATIVE

## 2021-01-08 LAB — SEDIMENTATION RATE: Sed Rate: 112 mm/hr — ABNORMAL HIGH (ref 0–16)

## 2021-01-08 LAB — APTT: aPTT: 38 seconds — ABNORMAL HIGH (ref 24–36)

## 2021-01-08 MED ORDER — SODIUM CHLORIDE 0.9 % IV SOLN
1.0000 g | Freq: Once | INTRAVENOUS | Status: AC
  Administered 2021-01-08: 15:00:00 1 g via INTRAVENOUS
  Filled 2021-01-08: qty 10

## 2021-01-08 MED ORDER — IPRATROPIUM-ALBUTEROL 0.5-2.5 (3) MG/3ML IN SOLN
3.0000 mL | Freq: Three times a day (TID) | RESPIRATORY_TRACT | Status: DC
  Administered 2021-01-09: 3 mL via RESPIRATORY_TRACT
  Filled 2021-01-08: qty 3

## 2021-01-08 MED ORDER — IOHEXOL 300 MG/ML  SOLN
75.0000 mL | Freq: Once | INTRAMUSCULAR | Status: AC | PRN
  Administered 2021-01-08: 09:00:00 75 mL via INTRAVENOUS

## 2021-01-08 MED ORDER — SODIUM CHLORIDE 0.9 % IV SOLN
2.0000 g | INTRAVENOUS | Status: DC
  Administered 2021-01-09: 2 g via INTRAVENOUS
  Filled 2021-01-08 (×2): qty 20

## 2021-01-08 NOTE — Progress Notes (Signed)
PROGRESS NOTE     Jeffrey Krause, is a 65 y.o. male, DOB - 1955/05/24, NWG:[id number]  Admit date - 01/07/2021   Admitting Physician Bernadette Hoit, DO  Outpatient Primary MD for the patient is Pcp, No  LOS - 1  Chief Complaint  Patient presents with   Shortness of Breath      Brief Summary:- 65 y.o. male significant medical history of psoriasis, tobacco admitted on 01/07/2021 with acute hypoxic respiratory failure secondary to left-sided community-acquired pneumonia  Assessment & Plan:   Principal Problem:   CAP (community acquired pneumonia) Active Problems:   Hypokalemia   Left arm swelling   Leukocytosis   Thrombocytosis   Psoriasis   Tobacco use     Brief Summary:- 65 y.o. male significant medical history of psoriasis, tobacco admitted on 01/07/2021 with acute hypoxic respiratory failure secondary to left-sided community-acquired pneumonia   A/p 1)Acute hypoxic respiratory failure--- due to left-sided community-acquired pneumonia CT Chest shows---Diffuse tree-in-bud opacities in both lungs, most advanced in the inferior left lower lobe--??  Infection versus inflammation --ESR is 112 despite steroids -Get pulmonology consult -BNP is 57 -Currently on 4 L of oxygen via nasal cannula -Continue Rocephin/azithromycin along with bronchodilators and mucolytics   2)Lt Hand/Wrist swelling and pain/Pain----ultrasound/Doppler without DVT x-rays without acute fracture or foreign body --ESR is 112 despite steroids -uric acid levels WNL -Currently on Solu-Medrol for COPD, this should help if this is inflammatory in nature   3)Tobacco abuse/COPD exacerbation----nicotine patch and bronchodilators as ordered, and Solu-Medrol  4)Lt shoulder Pain with Reduced ROM---??  Rotator cuff injury, get x-rays to rule out fractures or dislocations -ESR is 112 despite steroids - 5)Possible Liver Cirrhosis--- CT chest findings noted, will  get liver ultrasound  Disposition/Need for  in-Hospital Stay- patient unable to be discharged at this time due to acute hypoxic respiratory failure due to pneumonia, requiring IV antibiotics, IV steroids and supplemental oxygen -Anticipate discharge home greater than 2 days from now  Status is: Inpatient  Remains inpatient appropriate because: as above  Disposition: The patient is from: Home              Anticipated d/c is to: Home              Anticipated d/c date is: 2 days              Patient currently is not medically stable to d/c. Barriers: Not Clinically Stable-   Code Status :  -  Code Status: Full Code   Family Communication:    NA (patient is alert, awake and coherent)   Consults  :  Pulm  DVT Prophylaxis  :   - SCDs  enoxaparin (LOVENOX) injection 40 mg Start: 01/07/21 1000 SCDs Start: 01/07/21 0900    Lab Results  Component Value Date   PLT 549 (H) 01/08/2021    Inpatient Medications  Scheduled Meds:  dextromethorphan-guaiFENesin  1 tablet Oral BID   enoxaparin (LOVENOX) injection  40 mg Subcutaneous Q24H   ipratropium-albuterol  3 mL Nebulization Q6H   methylPREDNISolone (SOLU-MEDROL) injection  40 mg Intravenous Q12H   nicotine  21 mg Transdermal Daily   pantoprazole  40 mg Oral Daily   Continuous Infusions:  azithromycin 500 mg (01/08/21 0605)   [START ON 01/09/2021] cefTRIAXone (ROCEPHIN)  IV     PRN Meds:.acetaminophen, guaiFENesin-dextromethorphan, ipratropium-albuterol, oxyCODONE   Anti-infectives (From admission, onward)    Start     Dose/Rate Route Frequency Ordered Stop   01/09/21 0600  cefTRIAXone (ROCEPHIN) 2  g in sodium chloride 0.9 % 100 mL IVPB        2 g 200 mL/hr over 30 Minutes Intravenous Every 24 hours 01/08/21 1244     01/08/21 1345  cefTRIAXone (ROCEPHIN) 1 g in sodium chloride 0.9 % 100 mL IVPB        1 g 200 mL/hr over 30 Minutes Intravenous  Once 01/08/21 1245 01/08/21 1520   01/08/21 0600  cefTRIAXone (ROCEPHIN) 1 g in sodium chloride 0.9 % 100 mL IVPB  Status:   Discontinued        1 g 200 mL/hr over 30 Minutes Intravenous Every 24 hours 01/07/21 0719 01/08/21 1244   01/08/21 0600  azithromycin (ZITHROMAX) 500 mg in sodium chloride 0.9 % 250 mL IVPB        500 mg 250 mL/hr over 60 Minutes Intravenous Every 24 hours 01/07/21 0719 01/12/21 0559   01/07/21 0615  cefTRIAXone (ROCEPHIN) 1 g in sodium chloride 0.9 % 100 mL IVPB        1 g 200 mL/hr over 30 Minutes Intravenous  Once 01/07/21 0609 01/07/21 0653   01/07/21 0615  azithromycin (ZITHROMAX) tablet 500 mg        500 mg Oral  Once 01/07/21 0354 01/07/21 6568         Subjective: Alwin Lanigan today has no fevers, no emesis,  No chest pain,    -Cough and dyspnea persist -Hypoxia persist -Complains of left hand, left wrist and left shoulder pain with reduced range of motion, denies trauma   Objective: Vitals:   01/08/21 0749 01/08/21 1004 01/08/21 1422 01/08/21 1448  BP:    126/68  Pulse:    87  Resp:      Temp:  97.7 F (36.5 C)  97.9 F (36.6 C)  TempSrc:  Oral  Oral  SpO2: 94% 96% 95% 94%  Weight:      Height:        Intake/Output Summary (Last 24 hours) at 01/08/2021 1811 Last data filed at 01/08/2021 1502 Gross per 24 hour  Intake 1391.54 ml  Output --  Net 1391.54 ml   Filed Weights   01/07/21 0516  Weight: 83.9 kg    Physical Exam  Gen:- Awake Alert, dyspnea on exertion persist HEENT:- [place].AT, No sclera icterus Neck-Supple Neck,No JVD,.  Lungs-breath sounds with scattered rhonchi and wheezes  CV- S1, S2 normal, regular  Abd-  +ve B.Sounds, Abd Soft, No tenderness,    Extremity/Skin:-   pedal pulses present  Psych-affect is appropriate, oriented x3 Neuro-no new focal deficits, no tremors MSK-left shoulder with tenderness over the supraspinatus and deltoid areas, reduced range of motion, left wrist and hand swelling tenderness without significant bony tenderness or streaking  Data Reviewed: I have personally reviewed following labs and imaging  studies  CBC: Recent Labs  Lab 01/07/21 0522 01/08/21 0510  WBC 19.7* 22.9*  NEUTROABS 15.7*  --   HGB 12.5* 11.4*  HCT 38.3* 34.7*  MCV 80.6 81.5  PLT 553* 127*   Basic Metabolic Panel: Recent Labs  Lab 01/07/21 0522 01/07/21 0910 01/08/21 0510  NA 136  --  138  K 3.2*  --  4.4  CL 99  --  105  CO2 25  --  26  GLUCOSE 126*  --  158*  BUN 12  --  19  CREATININE 0.95  --  0.89  CALCIUM 8.6*  --  8.8*  MG  --  1.8  --   PHOS  --  2.6 3.5   GFR: Estimated Creatinine Clearance: 83 mL/min (by C-G formula based on SCr of 0.89 mg/dL). Liver Function Tests: Recent Labs  Lab 01/08/21 0510  ALBUMIN 2.4*   No results for input(s): LIPASE, AMYLASE in the last 168 hours. No results for input(s): AMMONIA in the last 168 hours. Coagulation Profile: No results for input(s): INR, PROTIME in the last 168 hours. Cardiac Enzymes: No results for input(s): CKTOTAL, CKMB, CKMBINDEX, TROPONINI in the last 168 hours. BNP (last 3 results) No results for input(s): PROBNP in the last 8760 hours. HbA1C: No results for input(s): HGBA1C in the last 72 hours. CBG: No results for input(s): GLUCAP in the last 168 hours. Lipid Profile: No results for input(s): CHOL, HDL, LDLCALC, TRIG, CHOLHDL, LDLDIRECT in the last 72 hours. Thyroid Function Tests: No results for input(s): TSH, T4TOTAL, FREET4, T3FREE, THYROIDAB in the last 72 hours. Anemia Panel: No results for input(s): VITAMINB12, FOLATE, FERRITIN, TIBC, IRON, RETICCTPCT in the last 72 hours. Urine analysis: No results found for: COLORURINE, APPEARANCEUR, LABSPEC, [place], GLUCOSEU, HGBUR, BILIRUBINUR, KETONESUR, PROTEINUR, UROBILINOGEN, NITRITE, LEUKOCYTESUR Sepsis Labs: _0 (procalcitonin:4,lacticidven:4)  ) Recent Results (from the past 240 hour(s))  Resp Panel by RT-PCR (Flu A&B, Covid) Nasopharyngeal Swab     Status: None   Collection Time: 01/07/21  5:50 AM   Specimen: Nasopharyngeal Swab; Nasopharyngeal(NP) swabs in  vial transport medium  Result Value Ref Range Status   SARS Coronavirus 2 by RT PCR NEGATIVE NEGATIVE Final    Comment: (NOTE) SARS-CoV-2 target nucleic acids are NOT DETECTED.  The SARS-CoV-2 RNA is generally detectable in upper respiratory specimens during the acute phase of infection. The lowest concentration of SARS-CoV-2 viral copies this assay can detect is 138 copies/mL. A negative result does not preclude SARS-Cov-2 infection and should not be used as the sole basis for treatment or other patient management decisions. A negative result may occur with  improper specimen collection/handling, submission of specimen other than nasopharyngeal swab, presence of viral mutation(s) within the areas targeted by this assay, and inadequate number of viral copies(<138 copies/mL). A negative result must be combined with clinical observations, patient history, and epidemiological information. The expected result is Negative.  Fact Sheet for Patients:  EntrepreneurPulse.com.au  Fact Sheet for Healthcare Providers:  IncredibleEmployment.be  This test is no t yet approved or cleared by the [place] FDA and  has been authorized for detection and/or diagnosis of SARS-CoV-2 by FDA under an Emergency Use Authorization (EUA). This EUA will remain  in effect (meaning this test can be used) for the duration of the COVID-19 declaration under Section 564(b)(1) of the Act, 21 U.S.C.section 360bbb-3(b)(1), unless the authorization is terminated  or revoked sooner.       Influenza A by PCR NEGATIVE NEGATIVE Final   Influenza B by PCR NEGATIVE NEGATIVE Final    Comment: (NOTE) The Xpert Xpress SARS-CoV-2/FLU/RSV plus assay is intended as an aid in the diagnosis of influenza from Nasopharyngeal swab specimens and should not be used as a sole basis for treatment. Nasal washings and aspirates are unacceptable for Xpert Xpress SARS-CoV-2/FLU/RSV testing.  Fact  Sheet for Patients: EntrepreneurPulse.com.au  Fact Sheet for Healthcare Providers: IncredibleEmployment.be  This test is not yet approved or cleared by the [place] FDA and has been authorized for detection and/or diagnosis of SARS-CoV-2 by FDA under an Emergency Use Authorization (EUA). This EUA will remain in effect (meaning this test can be used) for the duration of the COVID-19 declaration under Section 564(b)(1) of the Act,  21 U.S.C. section 360bbb-3(b)(1), unless the authorization is terminated or revoked.  Performed at [facility], [address], [place], [place] [address]       Radiology Studies: CT CHEST W CONTRAST  Result Date: 01/08/2021 CLINICAL DATA:  Pneumonia.  Hypoxic respiratory failure. EXAM: CT CHEST WITH CONTRAST TECHNIQUE: Multidetector CT imaging of the chest was performed during intravenous contrast administration. CONTRAST:  45m OMNIPAQUE IOHEXOL 300 MG/ML  SOLN COMPARISON:  No comparison studies available. FINDINGS: Cardiovascular: The heart size is normal. No substantial pericardial effusion. Mild atherosclerotic calcification is noted in the wall of the thoracic aorta. Mediastinum/Nodes: No mediastinal lymphadenopathy. There is no hilar lymphadenopathy. The esophagus has normal imaging features. Scattered lymph nodes are seen in both axillary regions without pathologic enlargement by CT criteria. Lungs/Pleura: Relatively diffuse micro nodularity seen in the right upper lobe with a tree-in-bud configuration in some regions. Similar tree-in-bud opacities identified in the right middle lobe, lingula, and right lower lobe, but disease is most advanced in the inferior left lower lobe where there is associated bronchial wall thickening, small airway impaction and areas of consolidative opacity. No pleural effusion. Upper Abdomen: Liver contour shows a fine nodular character, raising the question of cirrhosis. Musculoskeletal: No  worrisome lytic or sclerotic osseous abnormality. IMPRESSION: 1. Diffuse tree-in-bud opacities in both lungs, most advanced in the inferior left lower lobe. Imaging features compatible with infectious/inflammatory etiology. Atypical infection would be a distinct consideration. 2. Subtle nodular character of the liver contour raises the question of cirrhosis. 3. Aortic Atherosclerosis (ICD10-I70.0). Electronically Signed   By: EMisty StanleyM.D.   On: 01/08/2021 09:57   UK[place]Venous Img Upper Uni Left  Result Date: 01/07/2021 CLINICAL DATA:  A 65year old male presents for evaluation of LEFT hand edema and pain. EXAM: LEFT UPPER EXTREMITY VENOUS DOPPLER ULTRASOUND TECHNIQUE: Gray-scale sonography with graded compression, as well as color Doppler and duplex ultrasound were performed to evaluate the upper extremity deep venous system from the level of the subclavian vein and including the jugular, axillary, basilic, radial, ulnar and upper cephalic vein. Spectral Doppler was utilized to evaluate flow at rest and with distal augmentation maneuvers. COMPARISON:  None FINDINGS: Contralateral Subclavian Vein: Respiratory phasicity is normal and symmetric with the symptomatic side. No evidence of thrombus. Normal compressibility. Internal Jugular Vein: No evidence of thrombus. Normal compressibility, respiratory phasicity and response to augmentation. Subclavian Vein: No evidence of thrombus. Normal compressibility, respiratory phasicity and response to augmentation. Axillary Vein: No evidence of thrombus. Normal compressibility, respiratory phasicity and response to augmentation. Cephalic Vein: No evidence of thrombus. Normal compressibility, respiratory phasicity and response to augmentation. Basilic Vein: No evidence of thrombus. Normal compressibility, respiratory phasicity and response to augmentation. Brachial Veins: No evidence of thrombus. Normal compressibility, respiratory phasicity and response to augmentation.  Radial Veins: No evidence of thrombus. Normal compressibility, respiratory phasicity and response to augmentation. Ulnar Veins: No evidence of thrombus. Normal compressibility, respiratory phasicity and response to augmentation. Other Findings:  None visualized. IMPRESSION: No evidence of DVT within the LEFT upper extremity. Electronically Signed   By: GZetta BillsM.D.   On: 01/07/2021 11:50   DG Chest Port 1 View  Result Date: 01/07/2021 CLINICAL DATA:  Difficulty breathing.  Left shoulder and arm pain. EXAM: PORTABLE CHEST 1 VIEW COMPARISON:  PA Lat 08/16/2014. FINDINGS: There is patchy consolidation in the base of the left lung most likely due to a consolidated pneumonia, with a small left pleural effusion which is most likely parapneumonic. There is no right  pleural effusion or pneumothorax and the remaining lungs are clear. The cardiac size is normal. Slight calcification aortic arch. Thoracic spondylosis. IMPRESSION: Patchy consolidation extending across the left base. Probable pneumonia with small parapneumonic effusion. Clinical correlation and radiographic follow-up recommended. If pneumonia is not clinically suspected then a chest CT would be indicated, Preferably with contrast. Electronically Signed   By: Telford Nab M.D.   On: 01/07/2021 05:52   DG Hand Complete Left  Result Date: 01/07/2021 CLINICAL DATA:  Pain and swelling of the left hand. EXAM: LEFT HAND - COMPLETE 3+ VIEW COMPARISON:  None. FINDINGS: Diffuse soft tissue swelling of the hand and proximal digits. No evidence of subcutaneous emphysema, radiopaque foreign body, fracture or malalignment. Relatively well-defined bubbly lucency within the capitate demonstrates a well-defined thin sclerotic margin. This likely represents a benign entity such as intra osseous ganglion, lipoma or a benign chondroid lesion. IMPRESSION: Diffuse soft tissue swelling without evidence of subcutaneous emphysema, retained foreign body or acute osseous  abnormality. Incidental note is made of a circumscribed bubbly lucency within the capitate bone likely representing a benign intra osseous lesion. Electronically Signed   By: Jacqulynn Cadet M.D.   On: 01/07/2021 15:09     Scheduled Meds:  dextromethorphan-guaiFENesin  1 tablet Oral BID   enoxaparin (LOVENOX) injection  40 mg Subcutaneous Q24H   ipratropium-albuterol  3 mL Nebulization Q6H   methylPREDNISolone (SOLU-MEDROL) injection  40 mg Intravenous Q12H   nicotine  21 mg Transdermal Daily   pantoprazole  40 mg Oral Daily   Continuous Infusions:  azithromycin 500 mg (01/08/21 0605)   [START ON 01/09/2021] cefTRIAXone (ROCEPHIN)  IV       LOS: 1 day    Roxan Hockey M.D on 01/08/2021 at 6:11 PM  Go to [email or web address] - for contact info  [facility] - Office  [phone]  If 7PM-7AM, please contact night-coverage [email or web address] Password [email or web address] 01/08/2021, 6:11 PM

## 2021-01-09 ENCOUNTER — Inpatient Hospital Stay (HOSPITAL_COMMUNITY): Payer: BC Managed Care – PPO

## 2021-01-09 DIAGNOSIS — J189 Pneumonia, unspecified organism: Secondary | ICD-10-CM | POA: Diagnosis not present

## 2021-01-09 DIAGNOSIS — Z720 Tobacco use: Secondary | ICD-10-CM | POA: Diagnosis not present

## 2021-01-09 DIAGNOSIS — M7989 Other specified soft tissue disorders: Secondary | ICD-10-CM | POA: Diagnosis not present

## 2021-01-09 DIAGNOSIS — E876 Hypokalemia: Secondary | ICD-10-CM | POA: Diagnosis not present

## 2021-01-09 DIAGNOSIS — K7460 Unspecified cirrhosis of liver: Secondary | ICD-10-CM | POA: Diagnosis present

## 2021-01-09 LAB — CBC
HCT: 32.7 % — ABNORMAL LOW (ref 39.0–52.0)
Hemoglobin: 10.7 g/dL — ABNORMAL LOW (ref 13.0–17.0)
MCH: 27 pg (ref 26.0–34.0)
MCHC: 32.7 g/dL (ref 30.0–36.0)
MCV: 82.6 fL (ref 80.0–100.0)
Platelets: 570 10*3/uL — ABNORMAL HIGH (ref 150–400)
RBC: 3.96 MIL/uL — ABNORMAL LOW (ref 4.22–5.81)
RDW: 15.3 % (ref 11.5–15.5)
WBC: 16.9 10*3/uL — ABNORMAL HIGH (ref 4.0–10.5)
nRBC: 0 % (ref 0.0–0.2)

## 2021-01-09 LAB — COMPREHENSIVE METABOLIC PANEL
ALT: 225 U/L — ABNORMAL HIGH (ref 0–44)
AST: 162 U/L — ABNORMAL HIGH (ref 15–41)
Albumin: 2.3 g/dL — ABNORMAL LOW (ref 3.5–5.0)
Alkaline Phosphatase: 71 U/L (ref 38–126)
Anion gap: 7 (ref 5–15)
BUN: 26 mg/dL — ABNORMAL HIGH (ref 8–23)
CO2: 26 mmol/L (ref 22–32)
Calcium: 8.4 mg/dL — ABNORMAL LOW (ref 8.9–10.3)
Chloride: 103 mmol/L (ref 98–111)
Creatinine, Ser: 0.96 mg/dL (ref 0.61–1.24)
GFR, Estimated: >60 mL/min (ref >60)
Glucose, Bld: 151 mg/dL — ABNORMAL HIGH (ref 70–99)
Potassium: 4.1 mmol/L (ref 3.5–5.1)
Sodium: 136 mmol/L (ref 135–145)
Total Bilirubin: 0.3 mg/dL (ref 0.3–1.2)
Total Protein: 6.6 g/dL (ref 6.5–8.1)

## 2021-01-09 LAB — C-REACTIVE PROTEIN: CRP: 6.8 mg/dL — ABNORMAL HIGH (ref <1.0)

## 2021-01-09 MED ORDER — GUAIFENESIN ER 600 MG PO TB12: 600.0000 mg | ORAL_TABLET | Freq: Two times a day (BID) | ORAL | 0 refills | Status: AC

## 2021-01-09 MED ORDER — NICOTINE 21 MG/24HR TD PT24: 21.0000 mg | MEDICATED_PATCH | Freq: Every day | TRANSDERMAL | 0 refills | Status: DC

## 2021-01-09 MED ORDER — CEFDINIR 300 MG PO CAPS: 300.0000 mg | ORAL_CAPSULE | Freq: Two times a day (BID) | ORAL | 0 refills | Status: AC

## 2021-01-09 MED ORDER — ACETAMINOPHEN 325 MG PO TABS: 650.0000 mg | ORAL_TABLET | Freq: Four times a day (QID) | ORAL | 0 refills | Status: DC | PRN

## 2021-01-09 MED ORDER — PREDNISONE 20 MG PO TABS: 40.0000 mg | ORAL_TABLET | Freq: Every day | ORAL | 0 refills | Status: AC

## 2021-01-09 MED ORDER — AZITHROMYCIN 500 MG PO TABS: 500.0000 mg | ORAL_TABLET | Freq: Every day | ORAL | 0 refills | Status: AC

## 2021-01-09 MED ORDER — TRAMADOL HCL 50 MG PO TABS: 50.0000 mg | ORAL_TABLET | Freq: Four times a day (QID) | ORAL | 0 refills | Status: DC | PRN

## 2021-01-09 MED ORDER — ALBUTEROL SULFATE HFA 108 (90 BASE) MCG/ACT IN AERS: 2.0000 | INHALATION_SPRAY | Freq: Four times a day (QID) | RESPIRATORY_TRACT | 2 refills | Status: DC | PRN

## 2021-01-09 MED ORDER — ALBUTEROL SULFATE (2.5 MG/3ML) 0.083% IN NEBU: 2.5000 mg | INHALATION_SOLUTION | RESPIRATORY_TRACT | 2 refills | Status: DC | PRN

## 2021-01-09 MED ORDER — UMECLIDINIUM-VILANTEROL 62.5-25 MCG/ACT IN AEPB: 1.0000 | INHALATION_SPRAY | Freq: Every day | RESPIRATORY_TRACT | 2 refills | Status: DC

## 2021-01-09 MED ORDER — PANTOPRAZOLE SODIUM 40 MG PO TBEC
40.0000 mg | DELAYED_RELEASE_TABLET | Freq: Every day | ORAL | 0 refills | Status: DC
Start: 2021-01-10 — End: 2022-05-21

## 2021-01-09 NOTE — Discharge Instructions (Signed)
1)Follow-up with PCP in about 1 week for repeat CBC, CMP, ESR/Sed Rate and CRP Tests  2)Follow - up with Dr. Kara Mead Pulmonologist in [place]- due to lung Infection -Address: [address] [place], [place] [address] Phone: [phone] OR call [phone] -Patient needs to be seen in the [place] office, Not in [place]  3)Avoid Alcohol and complete abstinence from tobacco advised--

## 2021-01-09 NOTE — Discharge Summary (Signed)
                                                                                  Jeffrey Krause, is a 65 y.o. male  DOB 06/05/1955  MRN [id number].  Admission date:  01/07/2021  Admitting Physician  Oladapo Adefeso, DO  Discharge Date:  01/09/2021   Primary MD  Pcp, No  Recommendations for primary care physician for things to follow:   1)Follow-up with PCP in about 1 week for repeat CBC, CMP, ESR/Sed Rate and CRP Tests  2)Follow - up with Dr. Rakesh Alva Pulmonologist in [place]- due to lung Infection -Address: [address], [place], [place] [address] Phone: [phone] OR call [phone] -Patient needs to be seen in the [place] office, Not in [place]  3)Avoid Alcohol and complete abstinence from tobacco advised--   Admission Diagnosis  COPD exacerbation (HCC) [J44.1] CAP (community acquired pneumonia) [J18.9] Community acquired pneumonia of left lower lobe of lung [J18.9]   Discharge Diagnosis  COPD exacerbation (HCC) [J44.1] CAP (community acquired pneumonia) [J18.9] Community acquired pneumonia of left lower lobe of lung [J18.9]    Principal Problem:   CAP (community acquired pneumonia)/Diffuse tree-in-bud opacities in both lungs Active Problems:   Tobacco use   Mild Liver cirrhosis (HCC)   Hypokalemia   Left arm swelling   Leukocytosis   Thrombocytosis   Psoriasis      Past Medical History:  Diagnosis Date   Psoriasis     History reviewed. No pertinent surgical history.    HPI  from the history and physical done on the day of admission:    Chief Complaint: Shortness of breath   HPI: Jeffrey Krause is a 65 y.o. male significant medical history of psoriasis, tobacco use who presents to the emergency department due to 1 week onset of chest congestion, cough with production of yellow phlegm, generalized weakness, body aches and shortness of breath which worsened within last 24 hours so he presented to the ED for further evaluation and management.   Patient endorsed history of asthma as a child, but denies any lung disease.  Patient does not follow-up with any physician and he has several years of tobacco use.  He denies chest pain, fever, headache, nausea, vomiting or abdominal pain.   ED Course:  In the emergency department, he was hemodynamically stable.  Work-up in the ED showed leukocytosis and thrombocytosis, BMP showed hypokalemia and mild hyperglycemia.  BNP was 57 Chest x-ray was suggestive of pneumonia at the left base Patient was started on IV antibiotics (ceftriaxone and azithromycin).  Breathing treatment was provided, hospitalist was asked to admit patient for further evaluation and management.       Hospital Course:    Brief Summary:- 65 y.o. male significant medical history of psoriasis, tobacco admitted on 01/07/2021 with acute hypoxic respiratory failure secondary to left-sided community-acquired pneumonia  A/p 1)Acute hypoxic respiratory failure--- due to left-sided community-acquired pneumonia CT Chest shows---Diffuse tree-in-bud opacities in both lungs, most advanced in the inferior left lower lobe--??  Infection versus inflammation --ESR is 112 despite steroids -.Discussed with pulmonologist--- advised outpt follow up  -BNP is 57 -Much improved after treatment with Rocephin/azithromycin along   with bronchodilators and mucolytics -Hypoxia resolved -Discharge on p.o. prednisone   2)Lt Hand/Wrist swelling and pain/Pain----ultrasound/Doppler without DVT x-rays without acute fracture or foreign body --ESR is 112 despite steroids -uric acid levels WNL -Much improved after treatment with Solu-Medrol for COPD, this should help if this is inflammatory in nature   3)Tobacco abuse/COPD exacerbation----received nicotine patch and bronchodilators as ordered,  -Respiratory status improved significantly with Solu-Medrol -Discharge on p.o. prednisone   4)Lt shoulder Pain with Reduced ROM---??  Rotator cuff injury,  x-rays  without fractures or dislocations -ESR is 112 despite steroids - 5)Possible Liver Cirrhosis--- CT chest findings noted,  liver ultrasound suggestive of early cirrhosis -Complete abstinence from alcohol and outpatient follow-up with GI advised  6) leukocytosis--- partly due to steroids should improve with steroid taper   Disposition/--- discharge home Disposition: The patient is from: Home              Anticipated d/c is to: Home              Code Status :  -  Code Status: Full Code    Family Communication:    NA (patient is alert, awake and coherent)    Consults  :  Discussed with  Pulm  Discharge Condition: Stable without hypoxia  Follow UP--pulmonologist, PCP and GI  Diet and Activity recommendation:  As advised  Discharge Instructions    Discharge Instructions     Call MD for:  difficulty breathing, headache or visual disturbances   Complete by: As directed    Call MD for:  persistant dizziness or light-headedness   Complete by: As directed    Call MD for:  persistant nausea and vomiting   Complete by: As directed    Call MD for:  severe uncontrolled pain   Complete by: As directed    Diet - low sodium heart healthy   Complete by: As directed    Discharge instructions   Complete by: As directed    1)Follow-up with PCP in about 1 week for repeat CBC, CMP, ESR/Sed Rate and CRP Tests  2)Follow - up with Dr. Rakesh Alva Pulmonologist in [place]- due to lung Infection -Address: [address], [place], [place] [address] Phone: [phone] OR call [phone] -Patient needs to be seen in the [place] office, Not in [place]  3)Avoid Alcohol and complete abstinence from tobacco advised--   For home use only DME Nebulizer machine   Complete by: As directed    Patient needs a nebulizer to treat with the following condition: COPD with acute exacerbation (HCC)   Length of Need: Lifetime   Increase activity slowly   Complete by: As directed         Discharge  Medications     Allergies as of 01/09/2021   No Known Allergies      Medication List     STOP taking these medications    naproxen 375 MG tablet Commonly known as: NAPROSYN       TAKE these medications    acetaminophen 325 MG tablet Commonly known as: TYLENOL Take 2 tablets (650 mg total) by mouth every 6 (six) hours as needed for mild pain, moderate pain or fever.   albuterol (2.5 MG/3ML) 0.083% nebulizer solution Commonly known as: PROVENTIL Take 3 mLs (2.5 mg total) by nebulization every 4 (four) hours as needed for wheezing or shortness of breath.   albuterol 108 (90 Base) MCG/ACT inhaler Commonly known as: VENTOLIN HFA Inhale 2 puffs into the lungs every 6 (six)   hours as needed for wheezing or shortness of breath.   azithromycin 500 MG tablet Commonly known as: ZITHROMAX Take 1 tablet (500 mg total) by mouth daily for 5 days.   cefdinir 300 MG capsule Commonly known as: OMNICEF Take 1 capsule (300 mg total) by mouth 2 (two) times daily for 7 days.   guaiFENesin 600 MG 12 hr tablet Commonly known as: Mucinex Take 1 tablet (600 mg total) by mouth 2 (two) times daily for 10 days.   nicotine 21 mg/24hr patch Commonly known as: NICODERM CQ - dosed in mg/24 hours Place 1 patch (21 mg total) onto the skin daily. Start taking on: January 10, 2021   pantoprazole 40 MG tablet Commonly known as: PROTONIX Take 1 tablet (40 mg total) by mouth daily. Start taking on: January 10, 2021   predniSONE 20 MG tablet Commonly known as: DELTASONE Take 2 tablets (40 mg total) by mouth daily with breakfast for 5 days.   traMADol 50 MG tablet Commonly known as: ULTRAM Take 1 tablet (50 mg total) by mouth every 6 (six) hours as needed for moderate pain. What changed: reasons to take this   umeclidinium-vilanterol 62.5-25 MCG/ACT Aepb Commonly known as: ANORO ELLIPTA Inhale 1 puff into the lungs daily.               Durable Medical Equipment  (From admission,  onward)           Start     Ordered   01/09/21 0000  For home use only DME Nebulizer machine       Question Answer Comment  Patient needs a nebulizer to treat with the following condition COPD with acute exacerbation ([place])   Length of Need Lifetime      01/09/21 1325           Major procedures and Radiology Reports - PLEASE review detailed and final reports for all details, in brief -   CT CHEST W CONTRAST  Result Date: 01/08/2021 CLINICAL DATA:  Pneumonia.  Hypoxic respiratory failure. EXAM: CT CHEST WITH CONTRAST TECHNIQUE: Multidetector CT imaging of the chest was performed during intravenous contrast administration. CONTRAST:  5m OMNIPAQUE IOHEXOL 300 MG/ML  SOLN COMPARISON:  No comparison studies available. FINDINGS: Cardiovascular: The heart size is normal. No substantial pericardial effusion. Mild atherosclerotic calcification is noted in the wall of the thoracic aorta. Mediastinum/Nodes: No mediastinal lymphadenopathy. There is no hilar lymphadenopathy. The esophagus has normal imaging features. Scattered lymph nodes are seen in both axillary regions without pathologic enlargement by CT criteria. Lungs/Pleura: Relatively diffuse micro nodularity seen in the right upper lobe with a tree-in-bud configuration in some regions. Similar tree-in-bud opacities identified in the right middle lobe, lingula, and right lower lobe, but disease is most advanced in the inferior left lower lobe where there is associated bronchial wall thickening, small airway impaction and areas of consolidative opacity. No pleural effusion. Upper Abdomen: Liver contour shows a fine nodular character, raising the question of cirrhosis. Musculoskeletal: No worrisome lytic or sclerotic osseous abnormality. IMPRESSION: 1. Diffuse tree-in-bud opacities in both lungs, most advanced in the inferior left lower lobe. Imaging features compatible with infectious/inflammatory etiology. Atypical infection would be a distinct  consideration. 2. Subtle nodular character of the liver contour raises the question of cirrhosis. 3. Aortic Atherosclerosis (ICD10-I70.0). Electronically Signed   By: EMisty StanleyM.D.   On: 01/08/2021 09:57   [place]Venous Img Upper Uni Left  Result Date: 01/07/2021 CLINICAL DATA:  A 65year old male presents for evaluation  of LEFT hand edema and pain. EXAM: LEFT UPPER EXTREMITY VENOUS DOPPLER ULTRASOUND TECHNIQUE: Gray-scale sonography with graded compression, as well as color Doppler and duplex ultrasound were performed to evaluate the upper extremity deep venous system from the level of the subclavian vein and including the jugular, axillary, basilic, radial, ulnar and upper cephalic vein. Spectral Doppler was utilized to evaluate flow at rest and with distal augmentation maneuvers. COMPARISON:  None FINDINGS: Contralateral Subclavian Vein: Respiratory phasicity is normal and symmetric with the symptomatic side. No evidence of thrombus. Normal compressibility. Internal Jugular Vein: No evidence of thrombus. Normal compressibility, respiratory phasicity and response to augmentation. Subclavian Vein: No evidence of thrombus. Normal compressibility, respiratory phasicity and response to augmentation. Axillary Vein: No evidence of thrombus. Normal compressibility, respiratory phasicity and response to augmentation. Cephalic Vein: No evidence of thrombus. Normal compressibility, respiratory phasicity and response to augmentation. Basilic Vein: No evidence of thrombus. Normal compressibility, respiratory phasicity and response to augmentation. Brachial Veins: No evidence of thrombus. Normal compressibility, respiratory phasicity and response to augmentation. Radial Veins: No evidence of thrombus. Normal compressibility, respiratory phasicity and response to augmentation. Ulnar Veins: No evidence of thrombus. Normal compressibility, respiratory phasicity and response to augmentation. Other Findings:  None visualized.  IMPRESSION: No evidence of DVT within the LEFT upper extremity. Electronically Signed   By: Zetta Bills M.D.   On: 01/07/2021 11:50   DG Chest Port 1 View  Result Date: 01/07/2021 CLINICAL DATA:  Difficulty breathing.  Left shoulder and arm pain. EXAM: PORTABLE CHEST 1 VIEW COMPARISON:  PA Lat 08/16/2014. FINDINGS: There is patchy consolidation in the base of the left lung most likely due to a consolidated pneumonia, with a small left pleural effusion which is most likely parapneumonic. There is no right pleural effusion or pneumothorax and the remaining lungs are clear. The cardiac size is normal. Slight calcification aortic arch. Thoracic spondylosis. IMPRESSION: Patchy consolidation extending across the left base. Probable pneumonia with small parapneumonic effusion. Clinical correlation and radiographic follow-up recommended. If pneumonia is not clinically suspected then a chest CT would be indicated, Preferably with contrast. Electronically Signed   By: Telford Nab M.D.   On: 01/07/2021 05:52   DG Shoulder Left  Result Date: 01/09/2021 CLINICAL DATA:  Chronic left shoulder pain EXAM: LEFT SHOULDER - 2+ VIEW COMPARISON:  None. FINDINGS: Glenohumeral joint is normal. There may be mild osteoarthritis of the AC joint. No other regional finding of note. IMPRESSION: Normal appearance of the glenohumeral joint. 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 Electronically Signed   By: Nelson Chimes M.D.   On: 01/09/2021 09:22   DG Hand Complete Left  Result Date: 01/07/2021 CLINICAL DATA:  Pain and swelling of the left hand. EXAM: LEFT HAND - COMPLETE 3+ VIEW COMPARISON:  None. FINDINGS: Diffuse soft tissue swelling of the hand and proximal digits. No evidence of subcutaneous emphysema, radiopaque foreign body, fracture or malalignment. Relatively well-defined bubbly lucency within the capitate demonstrates a well-defined thin sclerotic margin. This likely represents a benign entity such  as intra osseous ganglion, lipoma or a benign chondroid lesion. IMPRESSION: Diffuse soft tissue swelling without evidence of subcutaneous emphysema, retained foreign body or acute osseous abnormality. Incidental note is made of a circumscribed bubbly lucency within the capitate bone likely representing a benign intra osseous lesion. Electronically Signed   By: Jacqulynn Cadet M.D.   On: 01/07/2021 15:09   US Abdomen Limited RUQ (LIVER/GB)  Result Date: 01/09/2021 CLINICAL DATA:  Cirrhosis questioned  on CT scan of the chest. EXAM: ULTRASOUND ABDOMEN LIMITED RIGHT UPPER QUADRANT COMPARISON:  CT yesterday. FINDINGS: Gallbladder: No gallstones or wall thickening visualized. No sonographic Murphy sign noted by sonographer. Common bile duct: Diameter: 3.9 mm, normal Liver: Equivocal minimal increased echogenicity of the liver parenchyma. Very minimal nodular configuration of the surface of the liver. As with ultrasound, the findings are suggestive but not conclusive for diagnosis of cirrhosis. Portal vein is patent on color Doppler imaging with normal direction of blood flow towards the liver. Other: None. IMPRESSION: Ultrasound suggests minimal increased echogenicity of the liver with minimal micro nodular surface contour. As with CT, the findings are suggestive but not conclusive for the diagnosis of cirrhosis in the early stages. Electronically Signed   By: Mark  Shogry M.D.   On: 01/09/2021 09:21    Micro Results   Recent Results (from the past 240 hour(s))  Resp Panel by RT-PCR (Flu A&B, Covid) Nasopharyngeal Swab     Status: None   Collection Time: 01/07/21  5:50 AM   Specimen: Nasopharyngeal Swab; Nasopharyngeal(NP) swabs in vial transport medium  Result Value Ref Range Status   SARS Coronavirus 2 by RT PCR NEGATIVE NEGATIVE Final    Comment: (NOTE) SARS-CoV-2 target nucleic acids are NOT DETECTED.  The SARS-CoV-2 RNA is generally detectable in upper respiratory specimens during the acute phase  of infection. The lowest concentration of SARS-CoV-2 viral copies this assay can detect is 138 copies/mL. A negative result does not preclude SARS-Cov-2 infection and should not be used as the sole basis for treatment or other patient management decisions. A negative result may occur with  improper specimen collection/handling, submission of specimen other than nasopharyngeal swab, presence of viral mutation(s) within the areas targeted by this assay, and inadequate number of viral copies(<138 copies/mL). A negative result must be combined with clinical observations, patient history, and epidemiological information. The expected result is Negative.  Fact Sheet for Patients:  https://www.fda.gov/media/152166/download  Fact Sheet for Healthcare Providers:  https://www.fda.gov/media/152162/download  This test is no t yet approved or cleared by the [place] FDA and  has been authorized for detection and/or diagnosis of SARS-CoV-2 by FDA under an Emergency Use Authorization (EUA). This EUA will remain  in effect (meaning this test can be used) for the duration of the COVID-19 declaration under Section 564(b)(1) of the Act, 21 U.S.C.section 360bbb-3(b)(1), unless the authorization is terminated  or revoked sooner.       Influenza A by PCR NEGATIVE NEGATIVE Final   Influenza B by PCR NEGATIVE NEGATIVE Final    Comment: (NOTE) The Xpert Xpress SARS-CoV-2/FLU/RSV plus assay is intended as an aid in the diagnosis of influenza from Nasopharyngeal swab specimens and should not be used as a sole basis for treatment. Nasal washings and aspirates are unacceptable for Xpert Xpress SARS-CoV-2/FLU/RSV testing.  Fact Sheet for Patients: https://www.fda.gov/media/152166/download  Fact Sheet for Healthcare Providers: https://www.fda.gov/media/152162/download  This test is not yet approved or cleared by the [place] FDA and has been authorized for detection and/or diagnosis of  SARS-CoV-2 by FDA under an Emergency Use Authorization (EUA). This EUA will remain in effect (meaning this test can be used) for the duration of the COVID-19 declaration under Section 564(b)(1) of the Act, 21 U.S.C. section 360bbb-3(b)(1), unless the authorization is terminated or revoked.  Performed at [facility], [address] [place], [place] [address]    Today   Subjective   Lejuan Ahlers today has no new complaints  No fever  Or chills     No Nausea, Vomiting or Diarrhea         Patient has been seen and examined prior to discharge   Objective   Blood pressure (!) 145/77, pulse 87, temperature 97.9 F (36.6 C), temperature source Oral, resp. rate 20, height 5' 4" (1.626 m), weight 83.9 kg, SpO2 98 %.   Intake/Output Summary (Last 24 hours) at 01/09/2021 1325 Last data filed at 01/09/2021 0900 Gross per 24 hour  Intake 2321.54 ml  Output --  Net 2321.54 ml    Exam Gen:- Awake Alert, dyspnea on exertion persist HEENT:- [place].AT, No sclera icterus Neck-Supple Neck,No JVD,.  Lungs-improved air movement, no further wheezing CV- S1, S2 normal, regular  Abd-  +ve B.Sounds, Abd Soft, No tenderness,    Extremity/Skin:-   pedal pulses present  Psych-affect is appropriate, oriented x3 Neuro-no new focal deficits, no tremors MSK-much improved left shoulder  tenderness over the supraspinatus and deltoid areas, much improved range of motion, much improved left wrist and hand swelling tenderness without significant bony tenderness or streaking   Data Review   CBC w Diff:  Lab Results  Component Value Date   WBC 16.9 (H) 01/09/2021   HGB 10.7 (L) 01/09/2021   HCT 32.7 (L) 01/09/2021   PLT 570 (H) 01/09/2021   LYMPHOPCT 10 01/07/2021   MONOPCT 9 01/07/2021   EOSPCT 0 01/07/2021   BASOPCT 0 01/07/2021    CMP:  Lab Results  Component Value Date   NA 136 01/09/2021   K 4.1 01/09/2021   CL 103 01/09/2021   CO2 26 01/09/2021   BUN 26 (H) 01/09/2021   CREATININE 0.96  01/09/2021   PROT 6.6 01/09/2021   ALBUMIN 2.3 (L) 01/09/2021   BILITOT 0.3 01/09/2021   ALKPHOS 71 01/09/2021   AST 162 (H) 01/09/2021   ALT 225 (H) 01/09/2021  . Total Discharge time is about 33 minutes  Courage Emokpae M.D on 01/09/2021 at 1:25 PM  Go to [email or web address] -  for contact info  [facility] - Office  [phone]   

## 2021-01-09 NOTE — Progress Notes (Signed)
AVS paperwork discussed with pt. No questions at this time. Taken to main entrance via wheelchair for discharge.

## 2021-01-09 NOTE — TOC Progression Note (Signed)
Transition of Care [facility]) - Progression Note    Patient Details  Name: Jeffrey Krause MRN: [id number] Date of Birth: 1955-12-24  [facility] CM/SW Contact  Elliot Gault, LCSW Phone Number: 01/09/2021, 1:43 PM  Clinical Narrative:      Pt stable for dc home today per MD. Nebulizer machine ordered by MD. Discussed with pt and explained CMS provider options. Referred as requested and [employer] [facility] will deliver the machine to pt's home later today. Pt verbalizes understanding and agreement with dc plan.  There are no other TOC needs identified for dc.   Barriers to Discharge: Barriers Resolved  Expected Discharge Plan and Services           Expected Discharge Date: 01/09/21               DME Arranged: Nebulizer machine DME Agency: [employer] Date DME Agency Contacted: 01/09/21   Representative spoke with at DME Agency: Morrie Sheldon             Social Determinants of Health (SDOH) Interventions    Readmission Risk Interventions No flowsheet data found.

## 2021-01-10 LAB — LEGIONELLA PNEUMOPHILA SEROGP 1 UR AG: L. pneumophila Serogp 1 Ur Ag: NEGATIVE

## 2021-01-11 ENCOUNTER — Inpatient Hospital Stay: Payer: BC Managed Care – PPO | Admitting: Student

## 2021-01-11 NOTE — Progress Notes (Deleted)
Synopsis: Referred for pneumonia, possible COPD by No ref. provider found  Subjective:   PATIENT ID: Jeffrey Krause GENDER: male DOB: 1955/08/05, MRN: [id number]  No chief complaint on file.  65yM with history of smoking, psoriasis who was recently admitted to [facility] for acute hypoxic respiratory failure due to CAP, concern as well for underlying COPD.  Otherwise pertinent review of systems is negative.  Past Medical History:  Diagnosis Date   Psoriasis      No family history on file.   No past surgical history on file.  Social History   Socioeconomic History   Marital status: Single    Spouse name: Not on file   Number of children: Not on file   Years of education: Not on file   Highest education level: Not on file  Occupational History   Not on file  Tobacco Use   Smoking status: Every Day   Smokeless tobacco: Not on file  Substance and Sexual Activity   Alcohol use: No   Drug use: No   Sexual activity: Not on file  Other Topics Concern   Not on file  Social History Narrative   Not on file   Social Determinants of Health   Financial Resource Strain: Not on file  Food Insecurity: Not on file  Transportation Needs: Not on file  Physical Activity: Not on file  Stress: Not on file  Social Connections: Not on file  Intimate Partner Violence: Not on file     No Known Allergies   Outpatient Medications Prior to Visit  Medication Sig Dispense Refill   acetaminophen (TYLENOL) 325 MG tablet Take 2 tablets (650 mg total) by mouth every 6 (six) hours as needed for mild pain, moderate pain or fever. 12 tablet 0   albuterol (PROVENTIL) (2.5 MG/3ML) 0.083% nebulizer solution Take 3 mLs (2.5 mg total) by nebulization every 4 (four) hours as needed for wheezing or shortness of breath. 75 mL 2   albuterol (VENTOLIN HFA) 108 (90 Base) MCG/ACT inhaler Inhale 2 puffs into the lungs every 6 (six) hours as needed for wheezing or shortness of breath. 1 each 2   azithromycin  (ZITHROMAX) 500 MG tablet Take 1 tablet (500 mg total) by mouth daily for 5 days. 5 tablet 0   cefdinir (OMNICEF) 300 MG capsule Take 1 capsule (300 mg total) by mouth 2 (two) times daily for 7 days. 14 capsule 0   guaiFENesin (MUCINEX) 600 MG 12 hr tablet Take 1 tablet (600 mg total) by mouth 2 (two) times daily for 10 days. 20 tablet 0   nicotine (NICODERM CQ - DOSED IN MG/24 HOURS) 21 mg/24hr patch Place 1 patch (21 mg total) onto the skin daily. 28 patch 0   pantoprazole (PROTONIX) 40 MG tablet Take 1 tablet (40 mg total) by mouth daily. 30 tablet 0   predniSONE (DELTASONE) 20 MG tablet Take 2 tablets (40 mg total) by mouth daily with breakfast for 5 days. 10 tablet 0   traMADol (ULTRAM) 50 MG tablet Take 1 tablet (50 mg total) by mouth every 6 (six) hours as needed for moderate pain. 15 tablet 0   umeclidinium-vilanterol (ANORO ELLIPTA) 62.5-25 MCG/ACT AEPB Inhale 1 puff into the lungs daily. 60 each 2   No facility-administered medications prior to visit.       Objective:   Physical Exam:  General appearance: 65 y.o., male, NAD, conversant  Eyes: anicteric sclerae, moist conjunctivae; no lid-lag; PERRL, tracking appropriately HENT: NCAT; oropharynx, MMM, no mucosal  ulcerations; normal hard and soft palate Neck: Trachea midline; no lymphadenopathy, no JVD Lungs: CTAB, no crackles, no wheeze, with normal respiratory effort CV: RRR, no MRGs  Abdomen: Soft, non-tender; non-distended, BS present  Extremities: No peripheral edema, radial and DP pulses present bilaterally  Skin: Normal temperature, turgor and texture; no rash Psych: Appropriate affect Neuro: Alert and oriented to person and place, no focal deficit    There were no vitals filed for this visit.   on *** LPM *** RA BMI Readings from Last 3 Encounters:  01/07/21 31.76 kg/m  08/16/14 34.17 kg/m   Wt Readings from Last 3 Encounters:  01/07/21 185 lb (83.9 kg)  08/16/14 199 lb 1 oz (90.3 kg)     CBC     Component Value Date/Time   WBC 16.9 (H) 01/09/2021 0413   RBC 3.96 (L) 01/09/2021 0413   HGB 10.7 (L) 01/09/2021 0413   HCT 32.7 (L) 01/09/2021 0413   PLT 570 (H) 01/09/2021 0413   MCV 82.6 01/09/2021 0413   MCH 27.0 01/09/2021 0413   MCHC 32.7 01/09/2021 0413   RDW 15.3 01/09/2021 0413   LYMPHSABS 1.9 01/07/2021 0522   MONOABS 1.7 (H) 01/07/2021 0522   EOSABS 0.0 01/07/2021 0522   BASOSABS 0.0 01/07/2021 0522    12/3 strep, urine legionella neg HIV neg Covid-19, flu neg  Chest Imaging:  CT Chest 01/08/21 reviewed by me remarkable for widespread centrilobular nodules with TIP, more confluent in left base which is also atelectatic (plate-like), no concerning LAD  Pulmonary Functions Testing Results: No flowsheet data found.      Assessment & Plan:   # Smoking  # Bronchiolitis  Plan:      Omar Person, MD [facility] 01/11/2021 1:22 PM

## 2021-01-20 ENCOUNTER — Other Ambulatory Visit: Payer: Self-pay | Admitting: Family Medicine

## 2021-01-20 MED ORDER — PREDNISONE 20 MG PO TABS: 40.0000 mg | ORAL_TABLET | Freq: Every day | ORAL | 0 refills | Status: AC

## 2021-03-08 ENCOUNTER — Telehealth: Payer: Self-pay | Admitting: Orthopaedic Surgery

## 2021-03-08 ENCOUNTER — Encounter: Payer: Self-pay | Admitting: Orthopaedic Surgery

## 2021-03-08 ENCOUNTER — Ambulatory Visit (INDEPENDENT_AMBULATORY_CARE_PROVIDER_SITE_OTHER): Payer: BC Managed Care – PPO | Admitting: Orthopaedic Surgery

## 2021-03-08 ENCOUNTER — Other Ambulatory Visit: Payer: Self-pay

## 2021-03-08 VITALS — Ht 65.0 in | Wt 195.0 lb

## 2021-03-08 DIAGNOSIS — M25561 Pain in right knee: Secondary | ICD-10-CM

## 2021-03-08 DIAGNOSIS — G8929 Other chronic pain: Secondary | ICD-10-CM

## 2021-03-08 DIAGNOSIS — M25562 Pain in left knee: Secondary | ICD-10-CM | POA: Diagnosis not present

## 2021-03-08 DIAGNOSIS — M25461 Effusion, right knee: Secondary | ICD-10-CM

## 2021-03-08 DIAGNOSIS — M25462 Effusion, left knee: Secondary | ICD-10-CM | POA: Diagnosis not present

## 2021-03-08 DIAGNOSIS — F17210 Nicotine dependence, cigarettes, uncomplicated: Secondary | ICD-10-CM

## 2021-03-08 NOTE — Addendum Note (Signed)
Addended by: Jodene Nam A on: 03/08/2021 03:47 PM   Modules accepted: Orders

## 2021-03-08 NOTE — Addendum Note (Signed)
Addended by: Jodene Nam A on: 03/08/2021 03:19 PM   Modules accepted: Orders

## 2021-03-08 NOTE — Patient Instructions (Signed)

## 2021-03-08 NOTE — Addendum Note (Signed)
Addended by: Baird Kay on: 03/08/2021 03:32 PM   Modules accepted: Orders

## 2021-03-08 NOTE — Telephone Encounter (Signed)
Form received from patient's employer [employer] insurer [employer] - call to patient to discuss forms process through [facility] understands.

## 2021-03-08 NOTE — Progress Notes (Signed)
Subjective:    Patient ID: Jeffrey Krause, male    DOB: 31-Mar-1955, 66 y.o.   MRN: [id number]  HPI He has had chronic pain in both knees over the years getting worse over the last month or so.  He has no trauma.  He has swelling and popping and giving way, more on the right than the left.  The right knee bothers him the most.  He has no redness,no numbness.  He has tried Tylenol and rubs with little help.  The cold rainy days we are having makes it worse.  He limps.  He is in pain.   Review of Systems  Constitutional:  Positive for activity change.  Musculoskeletal:  Positive for arthralgias, gait problem and joint swelling.  All other systems reviewed and are negative. For Review of Systems, all other systems reviewed and are negative.  The following is a summary of the past history medically, past history surgically, known current medicines, social history and family history.  This information is gathered electronically by the computer from prior information and documentation.  I review this each visit and have found including this information at this point in the chart is beneficial and informative.   Past Medical History:  Diagnosis Date   Psoriasis     No past surgical history on file.  Current Outpatient Medications on File Prior to Visit  Medication Sig Dispense Refill   acetaminophen (TYLENOL) 325 MG tablet Take 2 tablets (650 mg total) by mouth every 6 (six) hours as needed for mild pain, moderate pain or fever. 12 tablet 0   albuterol (PROVENTIL) (2.5 MG/3ML) 0.083% nebulizer solution Take 3 mLs (2.5 mg total) by nebulization every 4 (four) hours as needed for wheezing or shortness of breath. 75 mL 2   albuterol (VENTOLIN HFA) 108 (90 Base) MCG/ACT inhaler Inhale 2 puffs into the lungs every 6 (six) hours as needed for wheezing or shortness of breath. 1 each 2   nicotine (NICODERM CQ - DOSED IN MG/24 HOURS) 21 mg/24hr patch Place 1 patch (21 mg total) onto the skin daily.  28 patch 0   pantoprazole (PROTONIX) 40 MG tablet Take 1 tablet (40 mg total) by mouth daily. 30 tablet 0   traMADol (ULTRAM) 50 MG tablet Take 1 tablet (50 mg total) by mouth every 6 (six) hours as needed for moderate pain. 15 tablet 0   umeclidinium-vilanterol (ANORO ELLIPTA) 62.5-25 MCG/ACT AEPB Inhale 1 puff into the lungs daily. 60 each 2   No current facility-administered medications on file prior to visit.    Social History   Socioeconomic History   Marital status: Single    Spouse name: Not on file   Number of children: Not on file   Years of education: Not on file   Highest education level: Not on file  Occupational History   Not on file  Tobacco Use   Smoking status: Every Day   Smokeless tobacco: Not on file  Substance and Sexual Activity   Alcohol use: No   Drug use: No   Sexual activity: Not on file  Other Topics Concern   Not on file  Social History Narrative   Not on file   Social Determinants of Health   Financial Resource Strain: Not on file  Food Insecurity: Not on file  Transportation Needs: Not on file  Physical Activity: Not on file  Stress: Not on file  Social Connections: Not on file  Intimate Partner Violence: Not on file  No family history on file.  Ht 5\' 5"  (1.651 m)    Wt 195 lb (88.5 kg)    BMI 32.45 kg/m   Body mass index is 32.45 kg/m.     Objective:   Physical Exam Vitals and nursing note reviewed. Exam conducted with a chaperone present.  Constitutional:      Appearance: He is well-developed.  HENT:     Head: Normocephalic and atraumatic.  Eyes:     Conjunctiva/sclera: Conjunctivae normal.     Pupils: Pupils are equal, round, and reactive to light.  Cardiovascular:     Rate and Rhythm: Normal rate and regular rhythm.  Pulmonary:     Effort: Pulmonary effort is normal.  Abdominal:     Palpations: Abdomen is soft.  Musculoskeletal:     Cervical back: Normal range of motion and neck supple.       Legs:  Skin:     General: Skin is warm and dry.  Neurological:     Mental Status: He is alert and oriented to person, place, and time.     Cranial Nerves: No cranial nerve deficit.     Motor: No abnormal muscle tone.     Coordination: Coordination normal.     Deep Tendon Reflexes: Reflexes are normal and symmetric. Reflexes normal.  Psychiatric:        Behavior: Behavior normal.        Thought Content: Thought content normal.        Judgment: Judgment normal.  X-rays were done of both knees, reported separately.  There is medial narrowing of both knees, more on the right and tricompartmental degenerative changes of the knees.  No fracture or loose body noted.  Bone quality is good.  There are no acute findings.       Assessment & Plan:   Encounter Diagnoses  Name Primary?   Chronic pain of right knee Yes   Chronic pain of left knee    PROCEDURE NOTE:  The patient request injection, verbal consent was obtained.  The right knee was prepped appropriately after time out was performed.   Sterile technique was observed and anesthesia was provided by ethyl chloride and a 20-gauge needle was used to inject the knee area.  A 16-gauge needle was then used to aspirate the knee.  Color of fluid aspirated was straw to yellow  Total cc's aspirated was 75.    Injection of 1 cc of DeproMedrol 40mg  with several cc's of plain xylocaine was then performed.  A band aid dressing was applied.  The patient was advised to apply ice later today and tomorrow to the injection sight as needed.   PROCEDURE NOTE:  The patient request injection, verbal consent was obtained.  The left knee was prepped appropriately after time out was performed.   Sterile technique was observed and anesthesia was provided by ethyl chloride and a 20-gauge needle was used to inject the knee area.  A 16-gauge needle was then used to aspirate the knee.  Color of fluid aspirated was straw  Total cc's aspirated was 25.    Injection  of 1 cc of DepoMedrol 40 mg with several cc's of plain xylocaine was then performed.  A band aid dressing was applied.  The patient was advised to apply ice later today and tomorrow to the injection sight as needed.   I will send joint fluid for crystal count.  I suspect possible gout.  He denies gout history.  Return in two weeks.  He  may need MRI of the right knee.  Stay out of work.  Call if any problem.  Precautions discussed.  Electronically Signed Darreld McleanWayne Marea Reasner, MD 2/1/202311:50 AM

## 2021-03-22 ENCOUNTER — Other Ambulatory Visit: Payer: Self-pay

## 2021-03-22 ENCOUNTER — Ambulatory Visit (INDEPENDENT_AMBULATORY_CARE_PROVIDER_SITE_OTHER): Payer: BC Managed Care – PPO | Admitting: Orthopaedic Surgery

## 2021-03-22 ENCOUNTER — Encounter: Payer: Self-pay | Admitting: Orthopaedic Surgery

## 2021-03-22 DIAGNOSIS — M25562 Pain in left knee: Secondary | ICD-10-CM | POA: Diagnosis not present

## 2021-03-22 DIAGNOSIS — M25561 Pain in right knee: Secondary | ICD-10-CM

## 2021-03-22 DIAGNOSIS — F17210 Nicotine dependence, cigarettes, uncomplicated: Secondary | ICD-10-CM | POA: Diagnosis not present

## 2021-03-22 DIAGNOSIS — G8929 Other chronic pain: Secondary | ICD-10-CM | POA: Diagnosis not present

## 2021-03-22 NOTE — Progress Notes (Signed)
I am doing well.  He has no pain of the knees and no swelling.  He wants to go back to work.  I cannot find lab results.  I will have staff check on this.  ROM of both knees is full, no pain, normal gait.  Encounter Diagnoses  Name Primary?   Chronic pain of right knee Yes   Chronic pain of left knee    Nicotine dependence, cigarettes, uncomplicated    I will see as needed.  Call if any problem.  Precautions discussed.  Electronically Signed Darreld Mclean, MD 2/15/20239:25 AM

## 2021-04-05 ENCOUNTER — Encounter: Payer: Self-pay | Admitting: Orthopaedic Surgery

## 2021-04-05 ENCOUNTER — Telehealth: Payer: Self-pay | Admitting: Orthopaedic Surgery

## 2021-04-05 NOTE — Telephone Encounter (Signed)
Patient came to office this morning,04/05/21, following our speaking with him on Monday, 04/03/21, about forms received via fax, from his employer [employer] short term disability insurer, [employer]. Patient said he did return to work per work note issued at [employer] office to return to work 03/22/21, per his office visit on that day. Patient stated that he had to begin out of work again, due to having much pain and difficulty performing his job after working 03/22/21, 03/23/21, and 03/24/21. He has forms indicating out of work starting 03/25/21. Work notes issued accordingly (sending message to Dr Hilda Lias).  ? Discussed forms process through [facility]; patient brought in the form fee today, 04/05/21, and signed the authorization forms. Voiced understanding of forms process.  ?

## 2021-04-11 ENCOUNTER — Encounter: Payer: Self-pay | Admitting: Orthopaedic Surgery

## 2021-04-11 ENCOUNTER — Other Ambulatory Visit: Payer: Self-pay

## 2021-04-11 ENCOUNTER — Ambulatory Visit (INDEPENDENT_AMBULATORY_CARE_PROVIDER_SITE_OTHER): Payer: BC Managed Care – PPO | Admitting: Orthopaedic Surgery

## 2021-04-11 ENCOUNTER — Telehealth: Payer: Self-pay | Admitting: Orthopaedic Surgery

## 2021-04-11 VITALS — BP 121/82 | HR 96 | Ht 65.0 in | Wt 191.0 lb

## 2021-04-11 DIAGNOSIS — G8929 Other chronic pain: Secondary | ICD-10-CM | POA: Diagnosis not present

## 2021-04-11 DIAGNOSIS — M25561 Pain in right knee: Secondary | ICD-10-CM

## 2021-04-11 DIAGNOSIS — F17210 Nicotine dependence, cigarettes, uncomplicated: Secondary | ICD-10-CM | POA: Diagnosis not present

## 2021-04-11 DIAGNOSIS — M25562 Pain in left knee: Secondary | ICD-10-CM | POA: Diagnosis not present

## 2021-04-11 NOTE — Progress Notes (Signed)
I need note to go back to work. ? ?His knees are better. The right knee had swelling the other week but today is doing well.  He has no new trauma. ? ?The right knee has some crepitus and slight effusion but full ROM.  Left knee has crepitus and full ROM.  He has no limp.  Both knees are stable,  No edema. ? ?Encounter Diagnoses  ?Name Primary?  ? Chronic pain of right knee Yes  ? Chronic pain of left knee   ? Nicotine dependence, cigarettes, uncomplicated   ? ?I will see as needed. ? ?Call if any problem. ? ?Precautions discussed. ? ?Electronically Signed ?Darreld Mclean, MD ?3/7/20232:41 PM ? ?

## 2021-04-11 NOTE — Telephone Encounter (Signed)
Patient's work note faxed to employer, Jeffrey Krause, fax#[phone], per his request; authorization on file. ?

## 2021-04-11 NOTE — Patient Instructions (Signed)
GIVE NOTE TO RETURN TO WORK ?

## 2021-06-01 ENCOUNTER — Encounter: Payer: Self-pay | Admitting: Nurse Practitioner

## 2021-06-01 ENCOUNTER — Ambulatory Visit (INDEPENDENT_AMBULATORY_CARE_PROVIDER_SITE_OTHER): Payer: BC Managed Care – PPO | Admitting: Nurse Practitioner

## 2021-06-01 DIAGNOSIS — R059 Cough, unspecified: Secondary | ICD-10-CM

## 2021-06-02 DIAGNOSIS — R059 Cough, unspecified: Secondary | ICD-10-CM | POA: Insufficient documentation

## 2021-06-02 NOTE — Patient Instructions (Signed)
Patient came to establish care but left without being seen by me ?

## 2021-06-02 NOTE — Progress Notes (Signed)
Patient came to establish care but left without being seen by me ?

## 2021-06-02 NOTE — Assessment & Plan Note (Signed)
Pt left without being seen by me

## 2021-07-04 ENCOUNTER — Encounter: Payer: Self-pay | Admitting: Nurse Practitioner

## 2021-07-04 ENCOUNTER — Ambulatory Visit (INDEPENDENT_AMBULATORY_CARE_PROVIDER_SITE_OTHER): Payer: BC Managed Care – PPO | Admitting: Nurse Practitioner

## 2021-07-04 VITALS — BP 162/86 | HR 86 | Ht 63.0 in | Wt 202.0 lb

## 2021-07-04 DIAGNOSIS — Z720 Tobacco use: Secondary | ICD-10-CM

## 2021-07-04 DIAGNOSIS — J449 Chronic obstructive pulmonary disease, unspecified: Secondary | ICD-10-CM | POA: Diagnosis not present

## 2021-07-04 DIAGNOSIS — G8929 Other chronic pain: Secondary | ICD-10-CM | POA: Insufficient documentation

## 2021-07-04 DIAGNOSIS — K7460 Unspecified cirrhosis of liver: Secondary | ICD-10-CM | POA: Diagnosis not present

## 2021-07-04 DIAGNOSIS — M25561 Pain in right knee: Secondary | ICD-10-CM

## 2021-07-04 DIAGNOSIS — Z1211 Encounter for screening for malignant neoplasm of colon: Secondary | ICD-10-CM

## 2021-07-04 DIAGNOSIS — I10 Essential (primary) hypertension: Secondary | ICD-10-CM | POA: Insufficient documentation

## 2021-07-04 MED ORDER — UNABLE TO FIND: 0 refills | Status: DC

## 2021-07-04 MED ORDER — UMECLIDINIUM-VILANTEROL 62.5-25 MCG/ACT IN AEPB: 1.0000 | INHALATION_SPRAY | Freq: Every day | RESPIRATORY_TRACT | 2 refills | Status: DC

## 2021-07-04 MED ORDER — LOSARTAN POTASSIUM 25 MG PO TABS: 25.0000 mg | ORAL_TABLET | Freq: Every day | ORAL | 0 refills | Status: DC

## 2021-07-04 MED ORDER — ALBUTEROL SULFATE HFA 108 (90 BASE) MCG/ACT IN AERS: 2.0000 | INHALATION_SPRAY | Freq: Four times a day (QID) | RESPIRATORY_TRACT | 2 refills | Status: DC | PRN

## 2021-07-04 NOTE — Assessment & Plan Note (Addendum)
Chronic condition, had steroid injection some months ago Taking Aleve as needed Followed by orthopedics Continue OTC  Aleve as needed Will Check ESR, CRP/ SED rate at next visit if symptoms persist

## 2021-07-04 NOTE — Assessment & Plan Note (Signed)
Patient counseled on alcohol cessation Avoid Tylenol Patient referred to GI CMP ordered

## 2021-07-04 NOTE — Assessment & Plan Note (Signed)
Chronic condition Currently not taking medication Albuterol inhaler 2 puffs every 6 hours as needed reorded Anoro Ellipta 62.5-25 mcg/ACT, 1 puff daily reordered Patient counseled on smoking cessation Referred to pulmonology placed

## 2021-07-04 NOTE — Assessment & Plan Note (Addendum)
BP Readings from Last 3 Encounters:  07/04/21 (!) 162/86  06/01/21 (!) 174/102  04/11/21 121/82  Chronic condition, currently not on medication Most current ECG shows sinus rhythm Start losartan 25 mg daily Monitor blood pressure daily at home keep a log and bring to next visit DASH diet advised engage in regular daily exercises at least 150 minutes weekly CMP today BMP in 2 weeks Follow-up in 4 weeks Blood pressure monitor ordered

## 2021-07-04 NOTE — Assessment & Plan Note (Signed)
Current everyday smoker, started smoking at age 66, currently smoking 1 pack of cigarettes every 3 days, need to quit smoking including worsening COPD, lung cancer, MI, stroke discussed with patient.  Patient is not ready to quit.  Smoking cessation educational materials given.  Follow-up in 4 weeks

## 2021-07-04 NOTE — Progress Notes (Addendum)
New Patient Office Visit  Subjective    Patient ID: Jeffrey Krause, male    DOB: [birth year]  Age: 66 y.o. MRN: [id number]  CC:  Chief Complaint  Patient presents with   New Patient (Initial Visit)    np   Joint Swelling    Elbow and knee right side since 01/09/21   Shortness of Breath    Since December on and off     HPI Jeffrey Krause with past medical history of COPD, mild liver cirrhosis, psoriasis, tobacco use, chronic pain of right knee, chronic pain of left knee presents to establish care for his chronic medical conditions.  Has no previous PCP. He has been seeing Dr Luna Glasgow for his chronic bilateral knee pain.    COPD.  Patient complain of chronic shortness of breath worse with exertion ,patient was admitted for pneumonia/COPD exacerbation 5 months ago, he was discharged home on albuterol inhaler,Anoro ellipta with instructions to follow-up with pulmonologist.  However he did not follow-up with pulmonology, did not pick up prescription for his inhalers due to problems with his insurance.  Current everyday smoker ,started smoking at age 69, smokes one pack every 3 days, not ready to quit.  Patient denies chest pain, syncope, edema  Possible liver cirrhosis. CT chest and US findings suggestive of early liver chirosis .  Takes 2 shots of liquior on some days, total of 6 shots a week. Pt denies bloody stools, abdominal pain, hematemesis.  He has not been seen by GI.   Patient is due for Tdap vaccine, shingles vaccine.  Need for both vaccines discussed with patient, patient refused vaccination today  Due for colon cancer screening, patient decided to do Cologuard test, Cologuard ordered today   Outpatient Encounter Medications as of 07/04/2021  Medication Sig   Ibuprofen (ADVIL PO) Take by mouth. As needed   losartan (COZAAR) 25 MG tablet Take 1 tablet (25 mg total) by mouth daily.   naproxen sodium (ALEVE) 220 MG tablet Take 220 mg by mouth. As needed   UNABLE TO FIND Blood  pressure cuff x 1  DX I10   acetaminophen (TYLENOL) 325 MG tablet Take 2 tablets (650 mg total) by mouth every 6 (six) hours as needed for mild pain, moderate pain or fever. (Patient not taking: Reported on 06/01/2021)   albuterol (PROVENTIL) (2.5 MG/3ML) 0.083% nebulizer solution Take 3 mLs (2.5 mg total) by nebulization every 4 (four) hours as needed for wheezing or shortness of breath. (Patient not taking: Reported on 06/01/2021)   albuterol (VENTOLIN HFA) 108 (90 Base) MCG/ACT inhaler Inhale 2 puffs into the lungs every 6 (six) hours as needed for wheezing or shortness of breath.   nicotine (NICODERM CQ - DOSED IN MG/24 HOURS) 21 mg/24hr patch Place 1 patch (21 mg total) onto the skin daily. (Patient not taking: Reported on 06/01/2021)   pantoprazole (PROTONIX) 40 MG tablet Take 1 tablet (40 mg total) by mouth daily. (Patient not taking: Reported on 06/01/2021)   umeclidinium-vilanterol (ANORO ELLIPTA) 62.5-25 MCG/ACT AEPB Inhale 1 puff into the lungs daily.   [DISCONTINUED] albuterol (VENTOLIN HFA) 108 (90 Base) MCG/ACT inhaler Inhale 2 puffs into the lungs every 6 (six) hours as needed for wheezing or shortness of breath. (Patient not taking: Reported on 06/01/2021)   [DISCONTINUED] traMADol (ULTRAM) 50 MG tablet Take 1 tablet (50 mg total) by mouth every 6 (six) hours as needed for moderate pain. (Patient not taking: Reported on 04/11/2021)   [DISCONTINUED] umeclidinium-vilanterol (ANORO ELLIPTA) 62.5-25  MCG/ACT AEPB Inhale 1 puff into the lungs daily. (Patient not taking: Reported on 06/01/2021)   No facility-administered encounter medications on file as of 07/04/2021.    Past Medical History:  Diagnosis Date   Psoriasis     History reviewed. No pertinent surgical history.  Family History  Problem Relation Age of Onset   Diabetes type II Father     Social History   Socioeconomic History   Marital status: Single    Spouse name: Not on file   Number of children: 30   Years of education:  Not on file   Highest education level: Not on file  Occupational History   Not on file  Tobacco Use   Smoking status: Every Day    Packs/day: 0.25    Years: 42.00    Pack years: 10.50    Types: Cigarettes   Smokeless tobacco: Not on file  Substance and Sexual Activity   Alcohol use: Yes    Alcohol/week: 2.0 standard drinks    Types: 2 Shots of liquor per week    Comment: a day   Drug use: No   Sexual activity: Not Currently  Other Topics Concern   Not on file  Social History Narrative   Not on file   Social Determinants of Health   Financial Resource Strain: Not on file  Food Insecurity: Not on file  Transportation Needs: Not on file  Physical Activity: Not on file  Stress: Not on file  Social Connections: Not on file  Intimate Partner Violence: Not on file    Review of Systems  Constitutional: Negative.  Negative for chills, fever and weight loss.  Respiratory:  Positive for cough and shortness of breath. Negative for hemoptysis, sputum production and wheezing.   Cardiovascular: Negative.  Negative for chest pain, palpitations, orthopnea, claudication and leg swelling.  Gastrointestinal:  Negative for abdominal pain, blood in stool, constipation, diarrhea, heartburn, melena, nausea and vomiting.  Musculoskeletal:  Positive for joint pain.  Neurological: Negative.  Negative for dizziness, tingling, tremors and headaches.  Psychiatric/Behavioral: Negative.  Negative for depression, hallucinations, substance abuse and suicidal ideas.        Objective    BP (!) 162/86   Pulse 86   Ht 5' 3" (1.6 m)   Wt 202 lb (91.6 kg)   SpO2 95%   BMI 35.78 kg/m   Physical Exam Constitutional:      General: He is not in acute distress.    Appearance: He is obese. He is not ill-appearing or toxic-appearing.  Cardiovascular:     Rate and Rhythm: Normal rate and regular rhythm. No extrasystoles are present.    Pulses: Normal pulses. No decreased pulses.     Heart sounds:  Normal heart sounds. No murmur heard.   No friction rub. No gallop.  Pulmonary:     Effort: Pulmonary effort is normal. No tachypnea, bradypnea, accessory muscle usage or respiratory distress.     Breath sounds: Examination of the right-upper field reveals decreased breath sounds. Examination of the left-upper field reveals decreased breath sounds. Examination of the right-middle field reveals decreased breath sounds. Examination of the left-middle field reveals decreased breath sounds. Examination of the right-lower field reveals decreased breath sounds. Examination of the left-lower field reveals decreased breath sounds. Decreased breath sounds present. No wheezing, rhonchi or rales.  Chest:     Chest wall: No mass, deformity, tenderness, crepitus or edema. There is no dullness to percussion.  Abdominal:     Palpations: Abdomen is  soft.  Musculoskeletal:        General: Normal range of motion.     Right lower leg: No tenderness. No edema.     Left lower leg: No tenderness. No edema.  Neurological:     Mental Status: He is alert and oriented to person, place, and time.     Cranial Nerves: No cranial nerve deficit.     Motor: No weakness.  Psychiatric:        Mood and Affect: Mood normal. Mood is not anxious.        Behavior: Behavior normal. Behavior is not agitated.        Assessment & Plan:   Problem List Items Addressed This Visit       Cardiovascular and Mediastinum   Primary hypertension - Primary    BP Readings from Last 3 Encounters:  07/04/21 (!) 162/86  06/01/21 (!) 174/102  04/11/21 121/82  Chronic condition, currently not on medication Most current ECG shows sinus rhythm Start losartan 25 mg daily Monitor blood pressure daily at home keep a log and bring to next visit DASH diet advised engage in regular daily exercises at least 150 minutes weekly CMP today BMP in 2 weeks Follow-up in 4 weeks Blood pressure monitor ordered      Relevant Medications    losartan (COZAAR) 25 MG tablet   Other Relevant Orders   CMP14+EGFR   CBC   Basic Metabolic Panel (BMET)     Respiratory   COPD (chronic obstructive pulmonary disease) (HCC)    Chronic condition Currently not taking medication Albuterol inhaler 2 puffs every 6 hours as needed reorded Anoro Ellipta 62.5-25 mcg/ACT, 1 puff daily reordered Patient counseled on smoking cessation Referred to pulmonology placed       Relevant Medications   albuterol (VENTOLIN HFA) 108 (90 Base) MCG/ACT inhaler   umeclidinium-vilanterol (ANORO ELLIPTA) 62.5-25 MCG/ACT AEPB   Other Relevant Orders   Ambulatory referral to Pulmonology     Digestive   Mild Liver cirrhosis ([place])    Patient counseled on alcohol cessation Avoid Tylenol Patient referred to GI CMP ordered       Relevant Orders   Ambulatory referral to Gastroenterology     Other   Tobacco use    Current everyday smoker, started smoking at age 81, currently smoking 1 pack of cigarettes every 3 days, need to quit smoking including worsening COPD, lung cancer, MI, stroke discussed with patient.  Patient is not ready to quit.  Smoking cessation educational materials given.  Follow-up in 4 weeks       Chronic pain of right knee    Chronic condition, had steroid injection some months ago Taking Aleve as needed Followed by orthopedics Continue OTC  Aleve as needed Will Check ESR, CRP/ SED rate at next visit if symptoms persist        Other Visit Diagnoses     Screening for colon cancer       Relevant Orders   Cologuard       Return in about 4 weeks (around 08/01/2021) for CPE/FU/HTN.   Renee Rival, FNP

## 2021-07-04 NOTE — Patient Instructions (Signed)
Please start taking losartan 25mg  daily for your high blood pressure, monitor blood pressure at home daily , write down the numbers and bring to your follow up appointment in 4 weeks    It is important that you exercise regularly at least 30 minutes 5 times a week.  Think about what you will eat, plan ahead. Choose " clean, green, fresh or frozen" over canned, processed or packaged foods which are more sugary, salty and fatty. 70 to 75% of food eaten should be vegetables and fruit. Three meals at set times with snacks allowed between meals, but they must be fruit or vegetables. Aim to eat over a 12 hour period , example 7 am to 7 pm, and STOP after  your last meal of the day. Drink water,generally about 64 ounces per day, no other drink is as healthy. Fruit juice is best enjoyed in a healthy way, by EATING the fruit.  Thanks for choosing [facility], we consider it a privelige to serve you.

## 2021-07-05 ENCOUNTER — Encounter: Payer: Self-pay | Admitting: Internal Medicine

## 2021-07-10 ENCOUNTER — Telehealth: Payer: Self-pay

## 2021-07-10 NOTE — Telephone Encounter (Signed)
Patient called said his insurance does not cover this medicine. Can something else be called in The inhaler and medicine, blood pressure medicine. Does not know what the name of medicine his provider called in for him, but insurance will not cover.Please contact patinet at [phone]  Pharamcy: [facility] [address] [place]

## 2021-07-10 NOTE — Telephone Encounter (Signed)
Called pt back to get more information. No answer left vm

## 2021-07-13 ENCOUNTER — Other Ambulatory Visit: Payer: Self-pay

## 2021-07-13 MED ORDER — UNABLE TO FIND: 0 refills | Status: DC

## 2021-07-13 MED ORDER — LOSARTAN POTASSIUM 25 MG PO TABS: 25.0000 mg | ORAL_TABLET | Freq: Every day | ORAL | 0 refills | Status: DC

## 2021-07-13 MED ORDER — ALBUTEROL SULFATE HFA 108 (90 BASE) MCG/ACT IN AERS: 2.0000 | INHALATION_SPRAY | Freq: Four times a day (QID) | RESPIRATORY_TRACT | 2 refills | Status: DC | PRN

## 2021-07-13 MED ORDER — UMECLIDINIUM-VILANTEROL 62.5-25 MCG/ACT IN AEPB
1.0000 | INHALATION_SPRAY | Freq: Every day | RESPIRATORY_TRACT | 2 refills | Status: DC
Start: 2021-07-13 — End: 2021-11-14

## 2021-07-13 NOTE — Telephone Encounter (Signed)
Spoke with pt he states that [facility] told him to contact his insurance company. He states that he had this problem before [facility] and pt will come pick up rx for bp cuff placed up front

## 2021-08-02 ENCOUNTER — Encounter: Payer: Self-pay | Admitting: Gastroenterology

## 2021-08-02 NOTE — Progress Notes (Deleted)
GI Office Note    Referring Provider: Renee Rival, FNP Primary Care Physician:  Renee Rival, FNP  Primary Gastroenterologist: Dr. Abbey Chatters  Chief Complaint   No chief complaint on file.    History of Present Illness   Jeffrey Krause is a 66 y.o. male presenting today at the request of [place], Dewaine Conger, FNP for cirrhosis.   Seen PCP on 07/04/21. Had admission to hospital in December 2022 for pneumonia/COPD flare. Was advised to follow up with pulmonology on discharge but at this time he had not. He also never picked up inhalers due to insurance issue. Admitted to smoking everyday, 1 pack every 3 days, smoking since age 73. Admitted to alcohol consumption of 2 shots of liquor on some days with about 6 shots a week. He denied abdominal pain, melena, or BRBPR. He requested Cologuard for colon cancer screening. Advised to start Losartan for HTN, follow DASH diet, CBC, CMP ordered. He was again referred to pulmonology, counseled on smoking and alcohol cessation.Return in 4 weeks.   Labs ordered at PCP visit have not been performed. Las labs from 01/09/21 with AST 162, ALT 225, Alk phos 71, T Bili 0.3, sodium 136, Hgb 10.7 - 12.5, Plts 570, aptt 38, no INR on file.   CT chest 01/08/21 -subtle nodular character of liver contour questionable for cirrhosis  US abdomen 01/09/21 -minimal increased echogenicity of the liver with minimal micronodular surface contour, suggestive but not conclusive for diagnosis of cirrhosis and early stages    Past Medical History:  Diagnosis Date   Psoriasis     No past surgical history on file.  Current Outpatient Medications  Medication Sig Dispense Refill   acetaminophen (TYLENOL) 325 MG tablet Take 2 tablets (650 mg total) by mouth every 6 (six) hours as needed for mild pain, moderate pain or fever. (Patient not taking: Reported on 06/01/2021) 12 tablet 0   albuterol (PROVENTIL) (2.5 MG/3ML) 0.083% nebulizer solution Take 3 mLs (2.5 mg  total) by nebulization every 4 (four) hours as needed for wheezing or shortness of breath. (Patient not taking: Reported on 06/01/2021) 75 mL 2   albuterol (VENTOLIN HFA) 108 (90 Base) MCG/ACT inhaler Inhale 2 puffs into the lungs every 6 (six) hours as needed for wheezing or shortness of breath. 1 each 2   Ibuprofen (ADVIL PO) Take by mouth. As needed     losartan (COZAAR) 25 MG tablet Take 1 tablet (25 mg total) by mouth daily. 90 tablet 0   naproxen sodium (ALEVE) 220 MG tablet Take 220 mg by mouth. As needed     nicotine (NICODERM CQ - DOSED IN MG/24 HOURS) 21 mg/24hr patch Place 1 patch (21 mg total) onto the skin daily. (Patient not taking: Reported on 06/01/2021) 28 patch 0   pantoprazole (PROTONIX) 40 MG tablet Take 1 tablet (40 mg total) by mouth daily. (Patient not taking: Reported on 06/01/2021) 30 tablet 0   umeclidinium-vilanterol (ANORO ELLIPTA) 62.5-25 MCG/ACT AEPB Inhale 1 puff into the lungs daily. 60 each 2   UNABLE TO FIND Blood pressure cuff x 1  DX I10 1 each 0   No current facility-administered medications for this visit.    Allergies as of 08/03/2021   (No Known Allergies)    Family History  Problem Relation Age of Onset   Diabetes type II Father     Social History   Socioeconomic History   Marital status: Single    Spouse name: Not on file  Number of children: 15   Years of education: Not on file   Highest education level: Not on file  Occupational History   Not on file  Tobacco Use   Smoking status: Every Day    Packs/day: 0.25    Years: 42.00    Total pack years: 10.50    Types: Cigarettes   Smokeless tobacco: Not on file  Substance and Sexual Activity   Alcohol use: Yes    Alcohol/week: 2.0 standard drinks of alcohol    Types: 2 Shots of liquor per week    Comment: a day   Drug use: No   Sexual activity: Not Currently  Other Topics Concern   Not on file  Social History Narrative   Not on file   Social Determinants of Health   Financial  Resource Strain: Not on file  Food Insecurity: Not on file  Transportation Needs: Not on file  Physical Activity: Not on file  Stress: Not on file  Social Connections: Not on file  Intimate Partner Violence: Not on file     Review of Systems   Gen: Denies any fever, chills, fatigue, weight loss, lack of appetite.  CV: Denies chest pain, heart palpitations, peripheral edema, syncope.  Resp: Denies shortness of breath at rest or with exertion. Denies wheezing or cough.  GI: Denies dysphagia or odynophagia. Denies jaundice, hematemesis, fecal incontinence. GU : Denies urinary burning, urinary frequency, urinary hesitancy MS: Denies joint pain, muscle weakness, cramps, or limitation of movement.  Derm: Denies rash, itching, dry skin Psych: Denies depression, anxiety, memory loss, and confusion Heme: Denies bruising, bleeding, and enlarged lymph nodes.   Physical Exam   There were no vitals taken for this visit. General:   Alert and oriented. Pleasant and cooperative. Well-nourished and well-developed.  Head:  Normocephalic and atraumatic. Eyes:  Without icterus, sclera clear and conjunctiva pink.  Ears:  Normal auditory acuity. Mouth:  No deformity or lesions, oral mucosa pink.  Lungs:  Clear to auscultation bilaterally. No wheezes, rales, or rhonchi. No distress.  Heart:  S1, S2 present without murmurs appreciated.  Abdomen:  +BS, soft, non-tender and non-distended. No HSM noted. No guarding or rebound. No masses appreciated.  Rectal:  Deferred  Msk:  Symmetrical without gross deformities. Normal posture. Extremities:  Without edema. Neurologic:  Alert and  oriented x4;  grossly normal neurologically. Skin:  Intact without significant lesions or rashes. Psych:  Alert and cooperative. Normal mood and affect.   Assessment   Jeffrey Krause is a 66 y.o. male with a history of HTN, tobacco use, alcohol use, chronic pain, and COPD*** presenting today for further evaluation and  management of cirrhosis.   Cirrhosis: Early stages, according to CT chest and abdominal ultrasound in December 2022 as stated above. Likely secondary to alcohol intake as patient consumes 6 shots of liquor weekly. He had elevation of platelets and evidence of anemia at this time as well along with elevated LFTS with AST 162, ALT 225. No INR on file and no prior HFP on file in the chart. Need to repeat US now and every 6 months for hepatoma screening. Will check MELD labs and labs to rule out viral and autoimmune etiologies. Will also get baseline AFP.   PLAN   *** Repeat RUQ U/S CBC, Anemia panel, CMP, PT-INR, Hep C Ab, Hep B surface Ag, Hep B surface Ab, Hep B core antibody, Hep A Ab, ANA, ASMA, AMA, IgG, IgA, IgM, AFP Cessation of alcohol Heart healthy diet.  Consider EGD and TCS in the near future.  Follow up in 6 months?   Venetia Night, MSN, FNP-BC, AGACNP-BC [facility]

## 2021-08-03 ENCOUNTER — Ambulatory Visit: Payer: BC Managed Care – PPO | Admitting: Gastroenterology

## 2021-08-17 ENCOUNTER — Encounter: Payer: BC Managed Care – PPO | Admitting: Nurse Practitioner

## 2021-08-17 ENCOUNTER — Institutional Professional Consult (permissible substitution): Payer: BC Managed Care – PPO | Admitting: Pulmonary Disease

## 2021-09-15 ENCOUNTER — Encounter: Payer: BC Managed Care – PPO | Admitting: Nurse Practitioner

## 2021-09-15 ENCOUNTER — Encounter: Payer: Self-pay | Admitting: Nurse Practitioner

## 2021-09-22 ENCOUNTER — Institutional Professional Consult (permissible substitution): Payer: BC Managed Care – PPO | Admitting: Internal Medicine

## 2021-10-06 ENCOUNTER — Other Ambulatory Visit: Payer: Self-pay | Admitting: Nurse Practitioner

## 2021-11-14 ENCOUNTER — Ambulatory Visit: Payer: BC Managed Care – PPO | Admitting: Internal Medicine

## 2021-11-14 ENCOUNTER — Encounter: Payer: Self-pay | Admitting: Internal Medicine

## 2021-11-14 ENCOUNTER — Institutional Professional Consult (permissible substitution): Payer: BC Managed Care – PPO | Admitting: Internal Medicine

## 2021-11-14 DIAGNOSIS — J449 Chronic obstructive pulmonary disease, unspecified: Secondary | ICD-10-CM | POA: Diagnosis not present

## 2021-11-14 DIAGNOSIS — F17210 Nicotine dependence, cigarettes, uncomplicated: Secondary | ICD-10-CM

## 2021-11-14 MED ORDER — UMECLIDINIUM-VILANTEROL 62.5-25 MCG/ACT IN AEPB: 1.0000 | INHALATION_SPRAY | Freq: Every day | RESPIRATORY_TRACT | 11 refills | Status: DC

## 2021-11-14 MED ORDER — UMECLIDINIUM-VILANTEROL 62.5-25 MCG/ACT IN AEPB
1.0000 | INHALATION_SPRAY | Freq: Every day | RESPIRATORY_TRACT | 11 refills | Status: DC
Start: 2021-11-14 — End: 2021-12-25

## 2021-11-14 NOTE — Progress Notes (Deleted)
   Jeffrey Krause, male    DOB: 1955-05-26    MRN: [id number]   Brief patient profile:  ***  yo*** *** referred to pulmonary clinic in [place]  11/14/2021 by *** for ***      History of Present Illness  11/14/2021  Pulmonary/ 1st office eval/ Melvyn Novas / [place] [facility]  No chief complaint on file.    Dyspnea:  *** Cough: *** Sleep: *** SABA use:   Past Medical History:  Diagnosis Date   Chronic pain    COPD (chronic obstructive pulmonary disease) (HCC)    HTN (hypertension)    Psoriasis    Tobacco use     Outpatient Medications Prior to Visit  Medication Sig Dispense Refill   acetaminophen (TYLENOL) 325 MG tablet Take 2 tablets (650 mg total) by mouth every 6 (six) hours as needed for mild pain, moderate pain or fever. (Patient not taking: Reported on 06/01/2021) 12 tablet 0   albuterol (PROVENTIL) (2.5 MG/3ML) 0.083% nebulizer solution Take 3 mLs (2.5 mg total) by nebulization every 4 (four) hours as needed for wheezing or shortness of breath. (Patient not taking: Reported on 06/01/2021) 75 mL 2   albuterol (VENTOLIN HFA) 108 (90 Base) MCG/ACT inhaler Inhale 2 puffs into the lungs every 6 (six) hours as needed for wheezing or shortness of breath. 1 each 2   Ibuprofen (ADVIL PO) Take by mouth. As needed     losartan (COZAAR) 25 MG tablet TAKE 1 TABLET (25 MG TOTAL) BY MOUTH DAILY. 90 tablet 0   naproxen sodium (ALEVE) 220 MG tablet Take 220 mg by mouth. As needed     nicotine (NICODERM CQ - DOSED IN MG/24 HOURS) 21 mg/24hr patch Place 1 patch (21 mg total) onto the skin daily. (Patient not taking: Reported on 06/01/2021) 28 patch 0   pantoprazole (PROTONIX) 40 MG tablet Take 1 tablet (40 mg total) by mouth daily. (Patient not taking: Reported on 06/01/2021) 30 tablet 0   umeclidinium-vilanterol (ANORO ELLIPTA) 62.5-25 MCG/ACT AEPB Inhale 1 puff into the lungs daily. 60 each 2   UNABLE TO FIND Blood pressure cuff x 1  DX I10 1 each 0   No facility-administered medications prior  to visit.     Objective:     There were no vitals taken for this visit.         Assessment   No problem-specific Assessment & Plan notes found for this encounter.     Christinia Gully, MD 11/14/2021

## 2021-11-14 NOTE — Patient Instructions (Addendum)
Plan A = Automatic = Always=    Anoro one click each am   Plan B = Backup (to supplement plan A, not to replace it) Only use your albuterol inhaler as a rescue medication to be used if you can't catch your breath by resting or doing a relaxed purse lip breathing pattern.  - The less you use it, the better it will work when you need it. - Ok to use the inhaler up to 2 puffs  every 4 hours if you must but call for appointment if use goes up over your usual need - Don't leave home without it !!  (think of it like the spare tire for your car)   Call with name of the blue pill you take in am   The key is to stop smoking completely before smoking completely stops you!  Schedule PFTs   My office will be contacting you by phone for referral for lung cancer screening   - if you don't hear back from my office within one week please call us back or notify us thru MyChart and we'll address it right away.   Please schedule a follow up visit in 3 months but call sooner if needed  with all medications /inhalers/ solutions in hand so we can verify exactly what you are taking. This includes all medications from all doctors and over the counters

## 2021-11-14 NOTE — Assessment & Plan Note (Addendum)
Active smoker -  11/14/2021  After extensive coaching inhaler device,  effectiveness =    90% DPI > continue anoro pending pfts   Pt is Group B in terms of symptom/risk and laba/lama therefore appropriate rx at this point >>>  anoro approp but needs to understand saba use better:  Re SABA :  I spent extra time with pt today reviewing appropriate use of albuterol for prn use on exertion with the following points: 1) saba is for relief of sob that does not improve by walking a slower pace or resting but rather if the pt does not improve after trying this first. 2) If the pt is convinced, as many are, that saba helps recover from activity faster then it's easy to tell if this is the case by re-challenging : ie stop, take the inhaler, then p 5 minutes try the exact same activity (intensity of workload) that just caused the symptoms and see if they are substantially diminished or not after saba 3) if there is an activity that reproducibly causes the symptoms, try the saba 15 min before the activity on alternate days   If in fact the saba really does help, then fine to continue to use it prn but advised may need to look closer at the maintenance regimen being used to achieve better control of airways disease with exertion.     >>> f/u in 3 months with pfts in meantime and return with all meds in hand using a trust but verify approach to confirm accurate Medication  Reconciliation The principal here is that until we are certain that the  patients are doing what we've asked, it makes no sense to ask them to do more.

## 2021-11-14 NOTE — Assessment & Plan Note (Signed)
Counseled re importance of smoking cessation but did not meet time criteria for separate billing    Low-dose CT lung cancer screening is recommended for patients who are 88-64 years of age with a 20+ pack-year history of smoking and who are currently smoking or quit <=15 years ago. No coughing up blood  No unintentional weight loss of > 15 pounds in the last 6 months - pt is eligible for scanning yearly until age 68 by present guidelines.    Each maintenance medication was reviewed in detail including emphasizing most importantly the difference between maintenance and prns and under what circumstances the prns are to be triggered using an action plan format where appropriate.  Total time for H and P, chart review, counseling, reviewing dpi/hfa device(s) , directly observing portions of ambulatory 02 saturation study/ and generating customized AVS unique to this office visit / same day charting = 45 min new pt eval

## 2021-11-14 NOTE — Progress Notes (Signed)
Jeffrey Krause, male    DOB: 1955/12/22    MRN: [id number]   Brief patient profile:  40  yobm  active smoker  referred to pulmonary clinic in [place]  11/14/2021 by Jeffrey Krause  for ? Copd       History of Present Illness  11/14/2021  Pulmonary/ 1st office eval/ Jeffrey Krause / [facility] on Anoro and a new "blue pill"  Chief Complaint  Patient presents with   Consult    Ref by PCP for COPD. 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 easier on anoro  Cough: none  Sleep: no resp issues able to lie flat  SABA use: using saba as maint since ran out of anoro, seems to help  No obvious day to day or daytime pattern/variability or assoc excess/ purulent sputum or mucus plugs or hemoptysis or cp or chest tightness, subjective wheeze or overt sinus or hb symptoms.   Sleeping  without nocturnal  or early am exacerbation  of respiratory  c/o's or need for noct saba. Also denies any obvious fluctuation of symptoms with weather or environmental changes or other aggravating or alleviating factors except as outlined above   No unusual exposure hx or h/o childhood pna/ asthma or knowledge of premature birth.  Current Allergies, Complete Past Medical History, Past Surgical History, Family History, and Social History were reviewed in Jeffrey Krause record.  ROS  The following are not active complaints unless bolded Hoarseness, sore throat, dysphagia, dental problems, itching, sneezing,  nasal congestion or discharge of excess mucus or purulent secretions, ear ache,   fever, chills, sweats, unintended wt loss or wt gain, classically pleuritic or exertional cp,  orthopnea pnd or arm/hand swelling  or leg swelling, presyncope, palpitations, abdominal pain, anorexia, nausea, vomiting, diarrhea  or change in bowel habits or change in bladder habits, change in stools or change in urine,  dysuria, hematuria,  rash, arthralgias, visual complaints, headache, numbness, weakness or ataxia or problems with walking or coordination,  change in mood or  memory.             Past Medical History:  Diagnosis Date   Chronic pain    COPD (chronic obstructive pulmonary disease) (HCC)    HTN (hypertension)    Psoriasis    Tobacco use     Outpatient Medications Prior to Visit  Medication Sig Dispense Refill   acetaminophen (TYLENOL) 325 MG tablet Take 2 tablets (650 mg total) by mouth every 6 (six) hours as needed for mild pain, moderate pain or fever. 12 tablet 0   albuterol (VENTOLIN HFA) 108 (90 Base) MCG/ACT inhaler Inhale 2 puffs into the lungs every 6 (six) hours as needed for wheezing or shortness of breath. 1 each 2   Ibuprofen (ADVIL PO) Take by mouth. As needed     losartan (COZAAR) 25 MG tablet TAKE 1 TABLET (25 MG TOTAL) BY MOUTH DAILY. 90 tablet 0   naproxen sodium (ALEVE) 220 MG tablet Take 220 mg by mouth. As needed     nicotine (NICODERM CQ - DOSED IN MG/24 HOURS) 21 mg/24hr patch Place 1 patch (21 mg total) onto the skin daily. 28 patch 0   pantoprazole (PROTONIX) 40 MG tablet Take 1 tablet (40 mg total) by mouth daily. 30 tablet 0   UNABLE TO FIND Blood pressure cuff x 1  DX I10 1 each 0   albuterol (PROVENTIL) (2.5 MG/3ML) 0.083% nebulizer solution Take 3 mLs (2.5 mg total) by nebulization every 4 (four) hours as needed for wheezing or shortness of breath. 75 mL 2   umeclidinium-vilanterol (ANORO ELLIPTA) 62.5-25 MCG/ACT AEPB Inhale 1 puff into the lungs daily. 60 each 2   No facility-administered medications prior to visit.     Objective:     BP 136/88 (BP Location: Left Arm, Patient Position: Sitting)   Pulse 90   Temp 98 F (36.7 C) (Temporal)   Ht 5\' 3"  (1.6 m)   Wt 198 lb 12.8 oz (90.2 kg)   SpO2 99% Comment: ra  BMI 35.22 kg/m   SpO2: 99 % (ra)  Pleasant amb mod obese bm nad   HEENT : Oropharynx  clear  Nasal turbinates nl    NECK :   without  apparent JVD/ palpable Nodes/TM    LUNGS: no acc muscle use,  Min barrel  contour chest wall with bilateral  slightly decreased bs s audible wheeze and  without cough on insp or exp maneuvers and min  Hyperresonant  to  percussion bilaterally    CV:  RRR  no s3 or murmur or increase in P2, and no edema   ABD:  soft and nontender with pos end  insp Hoover's  in the supine position.  No bruits or organomegaly appreciated   MS:  Nl gait/ ext warm without deformities Or obvious joint restrictions  calf tenderness, cyanosis or clubbing     SKIN: warm and dry without lesions    NEURO:  alert, approp, nl sensorium with  no motor or cerebellar deficits apparent.        I personally reviewed images and agree with radiology impression as follows:   Chest CT 01/08/22  Tree in bud changes, no significant emphysema    Assessment   COPD GOLD ?  Active smoker -  11/14/2021  After extensive coaching inhaler device,  effectiveness =    90% DPI > continue anoro pending pfts   Pt is Group B in terms of symptom/risk and laba/lama therefore appropriate rx at this point >>>  anoro approp but needs to understand saba use better:  Re SABA :  I spent extra time with pt today reviewing appropriate use of albuterol for prn use on exertion with the following points: 1) saba is for relief of sob that does not improve by walking a slower pace or resting but rather if the pt does not improve after trying this first. 2) If the pt is convinced, as many are, that saba helps recover from activity faster then it's easy to tell if this is the case by re-challenging : ie stop, take the inhaler, then p 5 minutes try the exact same activity (intensity of workload) that just caused the symptoms and see if they are substantially diminished or not after saba 3) if there is an activity that reproducibly causes the symptoms, try the saba 15 min before the activity on alternate days   If in fact the saba really does  help, then fine to continue to use it prn but advised may need to look closer at the maintenance regimen being used to achieve better control of airways disease with exertion.     >>> f/u in 3 months with pfts in meantime and return with all meds in hand using a trust but verify approach to confirm accurate Medication  Reconciliation The principal here is that until  we are certain that the  patients are doing what we've asked, it makes no sense to ask them to do more.     Cigarette smoker Counseled re importance of smoking cessation but did not meet time criteria for separate billing    Low-dose CT lung cancer screening is recommended for patients who are 74-78 years of age with a 20+ pack-year history of smoking and who are currently smoking or quit <=15 years ago. No coughing up blood  No unintentional weight loss of > 15 pounds in the last 6 months - pt is eligible for scanning yearly until age 19 by present guidelines.    Each maintenance medication was reviewed in detail including emphasizing most importantly the difference between maintenance and prns and under what circumstances the prns are to be triggered using an action plan format where appropriate.  Total time for H and P, chart review, counseling, reviewing dpi/hfa device(s) , directly observing portions of ambulatory 02 saturation study/ and generating customized AVS unique to this office visit / same day charting = 45 min new pt eval            Sandrea Hughs, MD 11/14/2021

## 2021-11-27 ENCOUNTER — Other Ambulatory Visit: Payer: Self-pay

## 2021-11-27 ENCOUNTER — Emergency Department (HOSPITAL_COMMUNITY)
Admission: EM | Admit: 2021-11-27 | Discharge: 2021-11-27 | Disposition: A | Discharge status: Home / Self Care | Payer: BC Managed Care – PPO | Attending: Emergency Medicine | Admitting: Emergency Medicine

## 2021-11-27 ENCOUNTER — Emergency Department (HOSPITAL_COMMUNITY): Payer: BC Managed Care – PPO

## 2021-11-27 ENCOUNTER — Encounter (HOSPITAL_COMMUNITY): Payer: Self-pay

## 2021-11-27 DIAGNOSIS — M25511 Pain in right shoulder: Secondary | ICD-10-CM

## 2021-11-27 DIAGNOSIS — M25521 Pain in right elbow: Secondary | ICD-10-CM

## 2021-11-27 DIAGNOSIS — Z7951 Long term (current) use of inhaled steroids: Secondary | ICD-10-CM | POA: Insufficient documentation

## 2021-11-27 DIAGNOSIS — J449 Chronic obstructive pulmonary disease, unspecified: Secondary | ICD-10-CM | POA: Insufficient documentation

## 2021-11-27 DIAGNOSIS — I10 Essential (primary) hypertension: Secondary | ICD-10-CM

## 2021-11-27 DIAGNOSIS — R52 Pain, unspecified: Secondary | ICD-10-CM

## 2021-11-27 MED ORDER — ACETAMINOPHEN 325 MG PO TABS
650.0000 mg | ORAL_TABLET | Freq: Once | ORAL | Status: AC
  Administered 2021-11-27: 650 mg via ORAL
  Filled 2021-11-27: qty 2

## 2021-11-27 MED ORDER — OXYCODONE-ACETAMINOPHEN 5-325 MG PO TABS: 1.0000 | ORAL_TABLET | ORAL | 0 refills | Status: DC | PRN

## 2021-11-27 MED ORDER — PREDNISONE 50 MG PO TABS
60.0000 mg | ORAL_TABLET | Freq: Once | ORAL | Status: AC
  Administered 2021-11-27: 60 mg via ORAL
  Filled 2021-11-27: qty 1

## 2021-11-27 MED ORDER — OXYCODONE HCL 5 MG PO TABS: 5.0000 mg | ORAL_TABLET | ORAL | 0 refills | Status: DC | PRN

## 2021-11-27 MED ORDER — PREDNISONE 50 MG PO TABS: 50.0000 mg | ORAL_TABLET | Freq: Every day | ORAL | 0 refills | Status: DC

## 2021-11-27 MED ORDER — IBUPROFEN 800 MG PO TABS
800.0000 mg | ORAL_TABLET | Freq: Once | ORAL | Status: AC
  Administered 2021-11-27: 800 mg via ORAL
  Filled 2021-11-27: qty 1

## 2021-11-27 MED ORDER — NAPROXEN 500 MG PO TABS: 500.0000 mg | ORAL_TABLET | Freq: Two times a day (BID) | ORAL | 0 refills | Status: DC

## 2021-11-27 NOTE — ED Provider Notes (Signed)
[facility] EMERGENCY DEPARTMENT Provider Note   CSN: [id number] Arrival date & time: 11/27/21  0515     History  Chief Complaint  Patient presents with   Shoulder Pain    Jeffrey Krause is a 66 y.o. male.  The history is provided by the patient.  Shoulder Pain He has history of hypertension, COPD, cirrhosis and comes in complaining of pain in his right shoulder, right elbow and numbness of his right thumb.  Pain started 2 days ago.  He does not recall any specific incident or trauma, but states that he does do a lot of lifting at work.  He has not taken anything for pain.   Home Medications Prior to Admission medications   Medication Sig Start Date End Date Taking? Authorizing Provider  acetaminophen (TYLENOL) 325 MG tablet Take 2 tablets (650 mg total) by mouth every 6 (six) hours as needed for mild pain, moderate pain or fever. 01/09/21   Shon Hale, MD  albuterol (VENTOLIN HFA) 108 (90 Base) MCG/ACT inhaler Inhale 2 puffs into the lungs every 6 (six) hours as needed for wheezing or shortness of breath. 07/13/21   Donell Beers, FNP  Ibuprofen (ADVIL PO) Take by mouth. As needed    [provider]  losartan (COZAAR) 25 MG tablet TAKE 1 TABLET (25 MG TOTAL) BY MOUTH DAILY. 10/06/21   Paseda, Baird Kay, FNP  naproxen sodium (ALEVE) 220 MG tablet Take 220 mg by mouth. As needed    [provider]  nicotine (NICODERM CQ - DOSED IN MG/24 HOURS) 21 mg/24hr patch Place 1 patch (21 mg total) onto the skin daily. 01/10/21   Shon Hale, MD  pantoprazole (PROTONIX) 40 MG tablet Take 1 tablet (40 mg total) by mouth daily. 01/10/21   Shon Hale, MD  umeclidinium-vilanterol (ANORO ELLIPTA) 62.5-25 MCG/ACT AEPB Inhale 1 puff into the lungs daily. 11/14/21   Nyoka Cowden, MD  UNABLE TO FIND Blood pressure cuff x 1  DX I10 07/13/21   Paseda, Baird Kay, FNP      Allergies    Patient has no known allergies.    Review of Systems   Review of Systems   All other systems reviewed and are negative.   Physical Exam Updated Vital Signs BP (!) 162/84 (BP Location: Left Arm)   Pulse 88   Temp 98 F (36.7 C) (Oral)   Resp 19   Ht 5\' 4"  (1.626 m)   Wt 95.3 kg   SpO2 99%   BMI 36.05 kg/m  Physical Exam Vitals and nursing note reviewed.   66 year old male, appears uncomfortable, but is in no acute distress. Vital signs are significant for elevated blood pressure. Oxygen saturation is 99%, which is normal. Head is normocephalic and atraumatic. PERRLA, EOMI. Oropharynx is clear. Neck is nontender and supple without adenopathy or JVD. Back is nontender and there is no CVA tenderness. Lungs are clear without rales, wheezes, or rhonchi. Chest is nontender. Heart has regular rate and rhythm without murmur. Abdomen is soft, flat, nontender without masses or hepatosplenomegaly and peristalsis is normoactive. Extremities: There is tenderness to palpation in the proximal portion of the right shoulder with maximum tenderness in the anterior deltoid groove.  There is pain on passive range of motion in virtually all directions.  There is tenderness to palpation rather diffusely around the right elbow.  There is marked pain with any movement.  Distal neurovascular exam is intact with normal sensation, even in the thumb which he  has the numbness.  There is also pulses and not prompt capillary refill.  Remainder of extremity exam is unremarkable. Skin is warm and dry without rash. Neurologic: Mental status is normal, cranial nerves are intact.  No objective motor or sensory deficits.  ED Results / Procedures / Treatments    Radiology DG Elbow 2 Views Right  Result Date: 11/27/2021 CLINICAL DATA:  66 year old male with persistent right shoulder and elbow pain for 2 days with no known injury. Limited range of motion due to severe pain. EXAM: RIGHT ELBOW - 2 VIEW COMPARISON:  No prior elbow series. FINDINGS: Three oblique views of the right elbow. Poor  evaluation for joint effusion, but evidence of soft tissue swelling. Bulky lateral elbow chronic appearing degenerative spurring and small round bone fragments. No acute fracture or or dislocation is evident. Underlying bone mineralization appears normal. IMPRESSION: Three oblique views, limiting evaluation. No acute osseous abnormality identified but bulky degenerative osseous changes along the lateral elbow. Evidence of soft tissue swelling, joint effusion not evaluated. Electronically Signed   By: Genevie Ann M.D.   On: 11/27/2021 06:37   DG Shoulder Right  Result Date: 11/27/2021 CLINICAL DATA:  66 year old male with persistent right shoulder and elbow pain for 2 days with no known injury. EXAM: RIGHT SHOULDER - 2+ VIEW COMPARISON:  Right shoulder series 08/16/2014. FINDINGS: Bone mineralization is within normal limits. No glenohumeral joint dislocation. Proximal right humerus intact. Right clavicle and scapula appear intact. Mild for age degenerative spurring at the shoulder, mostly the right [facility] joint. No acute osseous abnormality identified. Visible right ribs and chest appear negative. IMPRESSION: No acute osseous abnormality identified. Electronically Signed   By: Genevie Ann M.D.   On: 11/27/2021 06:34    Procedures Procedures    Medications Ordered in ED Medications  predniSONE (DELTASONE) tablet 60 mg (has no administration in time range)  ibuprofen (ADVIL) tablet 800 mg (800 mg Oral Given 11/27/21 0625)  acetaminophen (TYLENOL) tablet 650 mg (650 mg Oral Given 11/27/21 1062)    ED Course/ Medical Decision Making/ A&P                           Medical Decision Making Amount and/or Complexity of Data Reviewed Radiology: ordered.  Risk OTC drugs. Prescription drug management.   Pain in right shoulder and right elbow.  Consider polyarticular gout or other crystal arthropathy.  Doubt fracture or dislocation but I have ordered x-rays to evaluate for these.  Consider cervical  radiculopathy.  I have ordered an initial dose of ibuprofen and acetaminophen.  X-rays show no evidence of fracture or dislocation.  I have independently viewed the images, and agree with the radiologist's interpretation.  At this point, I am treating this as an inflammatory arthritis such as polyarticular gout.  I have ordered an initial dose of prednisone and a prescription for a 5-day course of prednisone.  I have also given a prescription for naproxen and a small number of oxycodone tablets to take for severe pain.  I have recommended that he initially use acetaminophen to supplement the naproxen.  I have recommended follow-up with orthopedics in 2 days if he is not starting to show some clinical improvement.  Final Clinical Impression(s) / ED Diagnoses Final diagnoses:  Acute pain of right shoulder  Pain in right elbow  Elevated blood pressure reading with diagnosis of hypertension    Rx / DC Orders ED Discharge Orders  Ordered    predniSONE (DELTASONE) 50 MG tablet  Daily        11/27/21 0715    naproxen (NAPROSYN) 500 MG tablet  2 times daily        11/27/21 0715    oxyCODONE (ROXICODONE) 5 MG immediate release tablet  Every 4 hours PRN        11/27/21 0715    oxyCODONE-acetaminophen (PERCOCET) 5-325 MG tablet  Every 4 hours PRN        11/27/21 0715              Dione Booze, MD 11/27/21 [phone]

## 2021-11-27 NOTE — Discharge Instructions (Addendum)
Your x-rays did not show any problem with the bones in your shoulder or elbow.  I have prescribed an anti-inflammatory steroid for you.  I have also prescribed naproxen as an anti-inflammatory medication.  You may add acetaminophen to get additional pain relief.  If you still need additional pain relief, you may take oxycodone.  If you are not seeing improvement in your shoulder and elbow pain in the next 2 days, please see the orthopedic doctor for further evaluation.

## 2021-11-27 NOTE — ED Triage Notes (Signed)
Pt c/o right shoulder and elbow pain that started on Saturday. Pt states he believes he hurt it at work and is unable to deal with the pain.

## 2021-11-29 ENCOUNTER — Ambulatory Visit: Payer: BC Managed Care – PPO | Admitting: Orthopaedic Surgery

## 2021-11-29 ENCOUNTER — Encounter: Payer: Self-pay | Admitting: Orthopaedic Surgery

## 2021-11-29 VITALS — BP 169/88 | HR 84 | Ht 64.0 in | Wt 210.0 lb

## 2021-11-29 DIAGNOSIS — M25522 Pain in left elbow: Secondary | ICD-10-CM | POA: Diagnosis not present

## 2021-11-29 DIAGNOSIS — Z0289 Encounter for other administrative examinations: Secondary | ICD-10-CM

## 2021-11-29 LAB — URIC ACID: Uric Acid, Serum: 10.2 mg/dL — ABNORMAL HIGH (ref 4.0–8.0)

## 2021-11-29 MED ORDER — HYDROCODONE-ACETAMINOPHEN 5-325 MG PO TABS: 1.0000 | ORAL_TABLET | ORAL | 0 refills | Status: AC | PRN

## 2021-11-29 MED ORDER — PREDNISONE 5 MG (21) PO TBPK: ORAL_TABLET | ORAL | 0 refills | Status: DC

## 2021-11-29 MED ORDER — ALLOPURINOL 300 MG PO TABS: 300.0000 mg | ORAL_TABLET | Freq: Every day | ORAL | 5 refills | Status: DC

## 2021-11-29 MED ORDER — COLCHICINE 0.6 MG PO TABS: ORAL_TABLET | ORAL | 3 refills | Status: DC

## 2021-11-29 NOTE — Addendum Note (Signed)
Addended by: Willette Pa on: 11/29/2021 04:29 PM   Modules accepted: Orders

## 2021-11-29 NOTE — Progress Notes (Addendum)
My elbow really hurts.  He has developed pain and swelling of the right elbow over the last 4 or 5 days.  He has no trauma.  He went to the ER and had X-rays of the elbow and the shoulder on 11-27-21.  I have reviewed the notes and the X-rays.  X-rays showed: IMPRESSION: Three oblique views, limiting evaluation. No acute osseous abnormality identified but bulky degenerative osseous changes along the lateral elbow. Evidence of soft tissue swelling, joint effusion not evaluated.  I have independently reviewed and interpreted x-rays of this patient done at another site by another physician or qualified health professional.  He has relatives with gout.  I am concerned he has gout.  He was given pain medicine but it only helped a little.    Right elbow has slight effusion and marked decreased ROM and pain.  There is pain to supination and pronation.  NV intact. ROM of shoulder is tender but full.  Encounter Diagnosis  Name Primary?   Pain in left elbow Yes   I am concerned he has gout.  I will get serum uric acid levels.  Return on Tuesday, October 31.  I will give prednisone dose pack plus pain medicine.  I have reviewed the [place] web site prior to prescribing narcotic medicine for this patient.  Call if any problem.  Precautions discussed.  Electronically Signed Sanjuana Kava, MD 10/25/20239:24 AM   Uric acid was 10.2 I will call in allopurinol 300 and colchicine.  Electronically Signed Sanjuana Kava, MD 10/25/20234:29 PM

## 2021-11-29 NOTE — Patient Instructions (Addendum)
Have your uric acid drawn because Dr.Keeling is checking for gout.  Take your medications as directed. Come back Tuesday to discuss the results.   You can ice your elbow or use warm compresses to help with the pain.  **OOW note until next visit on 12/05/21**

## 2021-11-30 ENCOUNTER — Telehealth: Payer: Self-pay | Admitting: Radiology

## 2021-11-30 NOTE — Telephone Encounter (Signed)
Patient has new prescription at pharmacy His uric acid was elevated  I tried calling him to advise left message for him to call back  To you FYI in case you get a message

## 2021-12-05 ENCOUNTER — Encounter: Payer: Self-pay | Admitting: Orthopaedic Surgery

## 2021-12-05 ENCOUNTER — Telehealth: Payer: Self-pay | Admitting: Orthopaedic Surgery

## 2021-12-05 ENCOUNTER — Ambulatory Visit (INDEPENDENT_AMBULATORY_CARE_PROVIDER_SITE_OTHER): Payer: BC Managed Care – PPO | Admitting: Orthopaedic Surgery

## 2021-12-05 DIAGNOSIS — M10022 Idiopathic gout, left elbow: Secondary | ICD-10-CM | POA: Diagnosis not present

## 2021-12-05 NOTE — Telephone Encounter (Signed)
Noted  

## 2021-12-05 NOTE — Telephone Encounter (Signed)
Patient requested at time of today's visit, 12/05/21, to have the update of office notes and work note forwarded to [employer], his employer's disability insurer.

## 2021-12-05 NOTE — Patient Instructions (Signed)
Tart Cherry Juice is a natural way to treat gout/uric acid. You would have to drink several bottles a day of this.   Take your medicine every day even if you are not having a gout flare.  Sudden stress can cause gout to flare up.   RTW note. Return today.   We will see you as you need [place].

## 2021-12-05 NOTE — Progress Notes (Signed)
I am much better.  His uric acid was 10.2, elevated.  I called in allopurinol and colchicine for him which really helped.  His left elbow is not painful today and has full ROM.  I have explained gout to him and that he needs to take the allopurinol daily from now on.  I can refill it if he calls.  Encounter Diagnosis  Name Primary?   Acute idiopathic gout of left elbow Yes   I will see him prn.  Call if any problem.  Precautions discussed.  Electronically Signed Sanjuana Kava, MD 10/31/20238:47 AM

## 2021-12-06 NOTE — Telephone Encounter (Signed)
Done

## 2021-12-14 ENCOUNTER — Encounter: Payer: Self-pay | Admitting: Radiology

## 2021-12-25 ENCOUNTER — Encounter: Payer: Self-pay | Admitting: Family Medicine

## 2021-12-25 ENCOUNTER — Ambulatory Visit (INDEPENDENT_AMBULATORY_CARE_PROVIDER_SITE_OTHER): Payer: BC Managed Care – PPO | Admitting: Family Medicine

## 2021-12-25 VITALS — BP 156/90 | HR 80 | Ht 65.0 in | Wt 201.0 lb

## 2021-12-25 DIAGNOSIS — Z720 Tobacco use: Secondary | ICD-10-CM | POA: Diagnosis not present

## 2021-12-25 DIAGNOSIS — I10 Essential (primary) hypertension: Secondary | ICD-10-CM

## 2021-12-25 DIAGNOSIS — E038 Other specified hypothyroidism: Secondary | ICD-10-CM

## 2021-12-25 DIAGNOSIS — E559 Vitamin D deficiency, unspecified: Secondary | ICD-10-CM

## 2021-12-25 DIAGNOSIS — F17210 Nicotine dependence, cigarettes, uncomplicated: Secondary | ICD-10-CM

## 2021-12-25 DIAGNOSIS — Z1159 Encounter for screening for other viral diseases: Secondary | ICD-10-CM

## 2021-12-25 DIAGNOSIS — R7301 Impaired fasting glucose: Secondary | ICD-10-CM

## 2021-12-25 DIAGNOSIS — E7849 Other hyperlipidemia: Secondary | ICD-10-CM

## 2021-12-25 DIAGNOSIS — Z1211 Encounter for screening for malignant neoplasm of colon: Secondary | ICD-10-CM

## 2021-12-25 DIAGNOSIS — Z0001 Encounter for general adult medical examination with abnormal findings: Secondary | ICD-10-CM | POA: Insufficient documentation

## 2021-12-25 DIAGNOSIS — J449 Chronic obstructive pulmonary disease, unspecified: Secondary | ICD-10-CM | POA: Diagnosis not present

## 2021-12-25 MED ORDER — UMECLIDINIUM-VILANTEROL 62.5-25 MCG/ACT IN AEPB: 1.0000 | INHALATION_SPRAY | Freq: Every day | RESPIRATORY_TRACT | 11 refills | Status: DC

## 2021-12-25 NOTE — Patient Instructions (Signed)
I appreciate the opportunity to provide care to you today!    Follow up:  2 weeks  Labs: please stop by the lab today to get your blood drawn (CBC, CMP, TSH, Lipid profile, HgA1c, Vit D)  Screening: Hep C   Please pick up your refills at the pharmacy   Please continue to a heart-healthy diet and increase your physical activities. Try to exercise for at least three times a week.      It was a pleasure to see you and I look forward to continuing to work together on your health and well-being. Please do not hesitate to call the office if you need care or have questions about your care.   Have a wonderful day and week. With Gratitude, [name] MSN, FNP-BC

## 2021-12-25 NOTE — Assessment & Plan Note (Signed)
Uncontrolled He takes losartan 25 mg daily, but reports not to have taken his medication this morning He reports that he is under a great deal of stress and is grieving the loss of his youngest son, who died on 09-20-21 No changes were made to the treatment regimen today since the patient did not take his medication We will follow up in 2 weeks to assess blood pressure and make adjustments to his BP medication as needed

## 2021-12-25 NOTE — Assessment & Plan Note (Signed)
Active smoker He reports that he has been out of his maintenance inhaler for 2 weeks He complains of shortness of breath with exertion, wheezing, and cough We will refill his inhaler today and follow up in 2 weeks

## 2021-12-25 NOTE — Assessment & Plan Note (Signed)
Smokes about 1 pack/3&1/2day  Asked about quitting: confirms that he currently smokes cigarettes Advise to quit smoking: Educated about QUITTING to reduce the risk of cancer, cardio and cerebrovascular disease. Assess willingness: Unwilling to quit at this time, but is working on cutting back. Assist with counseling and pharmacotherapy: Counseled for 5 minutes and literature provided. Arrange for follow up: follow up in 3 months and continue to offer help.

## 2021-12-25 NOTE — Assessment & Plan Note (Signed)
Physical exam as documented Counseling is done on healthy lifestyle involving commitment to 150 minutes of exercise per week,  Discussed heart-healthy diet and attaining a healthy weight Changes in health habits are decided on by the patient with goals and time frames set for achieving them. Immunization and cancer screening needs are specifically addressed at this visit     

## 2021-12-25 NOTE — Progress Notes (Signed)
Complete physical exam  Patient: Jeffrey Krause   DOB: February 09, 1955   66 y.o. Male  MRN: [id number]  Subjective:    Chief Complaint  Patient presents with   Annual Exam    Pt reports sob due to copd.     Jeffrey Krause is a 66 y.o. male who presents today for a complete physical exam. He reports consuming a general diet. The patient has a physically strenuous job, but has no regular exercise apart from work.  He generally feels well. He reports sleeping well. He does not have additional problems to discuss today.     Most recent fall risk assessment:    12/25/2021    8:11 AM  Fall Risk   Falls in the past year? 0  Number falls in past yr: 0  Injury with Fall? 0  Risk for fall due to : No Fall Risks  Follow up Falls evaluation completed     Most recent depression screenings:    12/25/2021    8:12 AM 07/04/2021   11:04 AM  PHQ 2/9 Scores  PHQ - 2 Score 3 0  PHQ- 9 Score 8     Vision:Not within last year   Patient Active Problem List   Diagnosis Date Noted   Encounter for annual general medical examination with abnormal findings in adult 12/25/2021   COPD GOLD ?  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 No pertinent surgical history. Social History   Tobacco Use   Smoking status: Every Day    Packs/day: 0.25    Years: 42.00    Total pack years: 10.50    Types: Cigarettes  Substance Use Topics   Alcohol use: Yes     Alcohol/week: 2.0 standard drinks of alcohol    Types: 2 Shots of liquor per week    Comment: a day   Drug use: No   Social History   Socioeconomic History   Marital status: Single    Spouse name: Not on file   Number of children: 15   Years of education: Not on file   Highest education level: Not on file  Occupational History   Not on file  Tobacco Use   Smoking status: Every Day    Packs/day: 0.25    Years: 42.00    Total pack years: 10.50    Types: Cigarettes   Smokeless tobacco: Not on file  Substance and Sexual Activity   Alcohol use: Yes    Alcohol/week: 2.0 standard drinks of alcohol    Types: 2 Shots of liquor per week    Comment: a day   Drug use: No   Sexual activity: Not Currently  Other Topics Concern   Not on file  Social History Narrative   Not on file   Social Determinants of Health   Financial Resource Strain: Not on file  Food Insecurity: Not on file  Transportation Needs: Not on file  Physical Activity: Not on file  Stress: Not on file  Social Connections: Not on file  Intimate Partner Violence: Not on file   Family Status  Relation Name Status   Mother  Deceased       had brain suregry   Father  Deceased   Sister  Alive       has cancer, does not know what type   Family History  Problem Relation Age of Onset   Diabetes type II Father    No Known Allergies    Patient Care Team: Alvira Monday, FNP as PCP - General (Family Medicine)   Outpatient Medications Prior to Visit  Medication Sig   allopurinol (ZYLOPRIM) 300 MG tablet Take 1 tablet (300 mg total) by mouth daily.   colchicine 0.6 MG tablet One by mouth three times a day for five days for gout pain.   Ibuprofen (ADVIL PO) Take by mouth. As needed   naproxen (NAPROSYN) 500 MG tablet Take 1 tablet (500 mg total) by mouth 2 (two) times daily.   oxyCODONE (ROXICODONE) 5 MG immediate release tablet Take 1 tablet (5 mg total) by mouth every 4 (four) hours as needed for severe pain.    oxyCODONE-acetaminophen (PERCOCET) 5-325 MG tablet Take 1 tablet by mouth every 4 (four) hours as needed for moderate pain.   pantoprazole (PROTONIX) 40 MG tablet Take 1 tablet (40 mg total) by mouth daily.   predniSONE (DELTASONE) 50 MG tablet Take 50 mg by mouth daily with breakfast.   UNABLE TO FIND Blood pressure cuff x 1  DX I10   acetaminophen (TYLENOL) 325 MG tablet Take 2 tablets (650 mg total) by mouth every 6 (six) hours as needed for mild pain, moderate pain or fever. (Patient not taking: Reported on 12/05/2021)   albuterol (VENTOLIN HFA) 108 (90 Base) MCG/ACT inhaler Inhale 2 puffs into the lungs every 6 (six) hours as needed for wheezing or shortness of breath. (Patient not taking: Reported on 12/05/2021)   losartan (COZAAR) 25 MG tablet TAKE 1 TABLET (25 MG TOTAL) BY MOUTH DAILY. (Patient not taking: Reported on 12/05/2021)   naproxen sodium (ALEVE) 220 MG tablet Take 220 mg by mouth. As needed (Patient not taking: Reported on 12/25/2021)   [DISCONTINUED] umeclidinium-vilanterol (ANORO ELLIPTA) 62.5-25 MCG/ACT AEPB Inhale 1 puff into the lungs daily. (Patient not taking: Reported on 12/05/2021)   No facility-administered medications prior to visit.    Review of Systems  Constitutional:  Negative for chills and fever.  HENT:  Negative for sinus pain and sore throat.   Eyes:  Positive for blurred vision (sometimes).  Respiratory:  Positive for cough, shortness of breath (with exertion) and wheezing.   Cardiovascular:  Negative for chest pain.  Gastrointestinal:  Negative for constipation and diarrhea.  Genitourinary:  Negative for frequency and hematuria.  Musculoskeletal:  Negative for joint pain.  Skin:  Negative for itching.  Neurological:  Negative for dizziness and headaches.  Endo/Heme/Allergies:  Negative for polydipsia.  Psychiatric/Behavioral:  Negative for memory loss. The patient is not nervous/anxious.        Objective:    BP (!) 156/90   Pulse 80   Ht 5'  5" (1.651 m)   Wt 201 lb 0.6 oz (91.2 kg)   SpO2 99%   BMI 33.45 kg/m  BP Readings from Last 3 Encounters:  12/25/21 (!) 156/90  11/29/21 (!) 169/88  11/27/21 (!) 159/80   Wt Readings from Last 3 Encounters:  12/25/21 201 lb 0.6 oz (91.2 kg)  11/29/21 210 lb (95.3 kg)  11/27/21 210 lb (95.3 kg)      Physical Exam HENT:     Head: Normocephalic.     Right Ear: External ear normal.     Left Ear: External ear normal.     Nose: No congestion.     Mouth/Throat:     Mouth: Mucous membranes are moist.  Eyes:     Extraocular Movements: Extraocular movements intact.     Pupils: Pupils are equal, round, and reactive to light.  Cardiovascular:     Rate and Rhythm: Normal rate and regular rhythm.     Pulses: Normal pulses.     Heart sounds: Normal heart sounds.  Pulmonary:     Breath sounds: Normal breath sounds. No wheezing.  Abdominal:     Palpations: Abdomen is soft.     Tenderness: There is no right CVA tenderness or left CVA tenderness.  Musculoskeletal:     Cervical back: No rigidity.     Right lower leg: No edema.     Left lower leg: No edema.  Skin:    Findings: No bruising or erythema.  Neurological:     Mental Status: He is alert and oriented to person, place, and time.     GCS: GCS eye subscore is 4. GCS verbal subscore is 5. GCS motor subscore is 6.     Cranial Nerves: No facial asymmetry.     Motor: No tremor or pronator drift.     Coordination: Finger-Nose-Finger Test normal.     Gait: Gait normal.     No results found for any visits on 12/25/21. Last CBC Lab Results  Component Value Date   WBC 16.9 (H) 01/09/2021   HGB 10.7 (L) 01/09/2021   HCT 32.7 (L) 01/09/2021   MCV 82.6 01/09/2021   MCH 27.0 01/09/2021   RDW 15.3 01/09/2021   PLT 570 (H) 08/05/1599   Last metabolic panel Lab Results  Component Value Date   GLUCOSE 151 (H) 01/09/2021   NA 136 01/09/2021   K 4.1 01/09/2021   CL 103 01/09/2021   CO2 26 01/09/2021   BUN 26 (H) 01/09/2021    CREATININE 0.96 01/09/2021   GFRNONAA >60 01/09/2021   CALCIUM 8.4 (L) 01/09/2021   PHOS 3.5 01/08/2021   PROT 6.6 01/09/2021   ALBUMIN 2.3 (L) 01/09/2021   BILITOT 0.3 01/09/2021   ALKPHOS 71 01/09/2021   AST 162 (H) 01/09/2021   ALT 225 (H) 01/09/2021   ANIONGAP 7 01/09/2021   Last lipids No results found for: "CHOL", "HDL", "LDLCALC", "LDLDIRECT", "TRIG", "CHOLHDL" Last hemoglobin A1c No results found for: "HGBA1C" Last thyroid functions No results found for: "TSH", "T3TOTAL", "T4TOTAL", "THYROIDAB" Last vitamin D No results found for: "25OHVITD2", "25OHVITD3", "VD25OH" Last vitamin B12 and Folate No results found for: "VITAMINB12", "FOLATE"      Assessment & Plan:    Routine Health Maintenance and Physical Exam   There is no immunization history on file for this patient.  Health Maintenance  Topic Date Due   Hepatitis C Screening  Never done   COLONOSCOPY (Pts 45-20yr Insurance coverage will need to be confirmed)  Never done   Zoster Vaccines- Shingrix (1 of 2) Never done   HIV Screening  Completed   HPV VACCINES  Aged Out   INFLUENZA VACCINE  Discontinued   COVID-19 Vaccine  Discontinued    Discussed health benefits of physical activity, and encouraged him to engage in regular exercise appropriate for his age and condition.  Encounter for annual general medical examination with abnormal findings in adult Assessment & Plan: Physical exam as documented Counseling is done on healthy lifestyle involving commitment to 150 minutes of exercise per week,  Discussed heart-healthy diet and attaining a healthy weight Changes in health habits are decided on by the patient with goals and time frames set for achieving them. Immunization and cancer screening needs are specifically addressed at this visit       Primary hypertension Assessment & Plan: Uncontrolled He takes losartan 25 mg daily, but reports not to have taken his medication this morning He reports that  he is under a great deal of stress and is grieving the loss of his youngest son, who died on 10/07/21 No changes were made to the treatment regimen today since the patient did not take his medication We will follow up in 2 weeks to assess blood pressure and make adjustments to his BP medication as needed    COPD GOLD ?  Assessment & Plan: Active smoker He reports that he has been out of his maintenance inhaler for 2 weeks He complains of shortness of breath with exertion, wheezing, and cough We will refill his inhaler today and follow up in 2 weeks   Cigarette smoker Assessment & Plan: Smokes about 1 pack/3&1/2day  Asked about quitting: confirms that he currently smokes cigarettes Advise to quit smoking: Educated about QUITTING to reduce the risk of cancer, cardio and cerebrovascular disease. Assess willingness: Unwilling to quit at this time, but is working on cutting back. Assist with counseling and pharmacotherapy: Counseled for 5 minutes and literature provided. Arrange for follow up: follow up in 3 months and continue to offer help.    Chronic obstructive pulmonary disease, unspecified COPD type ([place]) -     Umeclidinium-Vilanterol; Inhale 1 puff into the lungs daily.  Dispense: 1 each; Refill: 11  Tobacco use  IFG (impaired fasting glucose) -     Hemoglobin A1c  Vitamin D deficiency -     VITAMIN D 25 Hydroxy (Vit-D Deficiency, Fractures)  Need for hepatitis C screening test -     Hepatitis C antibody  Other specified hypothyroidism -     TSH + free T4  Other hyperlipidemia -     Lipid panel -     CMP14+EGFR -     CBC with Differential/Platelet  Colon cancer screening -     Fecal occult blood, imunochemical    Return in 2 weeks (on 01/08/2022) for BP.     Alvira Monday, FNP

## 2021-12-26 LAB — CMP14+EGFR
ALT: 16 IU/L (ref 0–44)
AST: 24 IU/L (ref 0–40)
Albumin/Globulin Ratio: 1.3 (ref 1.2–2.2)
Albumin: 3.7 g/dL — ABNORMAL LOW (ref 3.9–4.9)
Alkaline Phosphatase: 98 IU/L (ref 44–121)
BUN/Creatinine Ratio: 10 (ref 10–24)
BUN: 13 mg/dL (ref 8–27)
Bilirubin Total: <0.2 mg/dL (ref 0.0–1.2)
CO2: 21 mmol/L (ref 20–29)
Calcium: 8 mg/dL — ABNORMAL LOW (ref 8.6–10.2)
Chloride: 106 mmol/L (ref 96–106)
Creatinine, Ser: 1.24 mg/dL (ref 0.76–1.27)
Globulin, Total: 2.9 g/dL (ref 1.5–4.5)
Glucose: 109 mg/dL — ABNORMAL HIGH (ref 70–99)
Potassium: 4.2 mmol/L (ref 3.5–5.2)
Sodium: 142 mmol/L (ref 134–144)
Total Protein: 6.6 g/dL (ref 6.0–8.5)
eGFR: 65 mL/min/{1.73_m2} (ref >59)

## 2021-12-26 LAB — LIPID PANEL
Chol/HDL Ratio: 4.4 ratio (ref 0.0–5.0)
Cholesterol, Total: 211 mg/dL — ABNORMAL HIGH (ref 100–199)
HDL: 48 mg/dL (ref >39)
LDL Chol Calc (NIH): 130 mg/dL — ABNORMAL HIGH (ref 0–99)
Triglycerides: 187 mg/dL — ABNORMAL HIGH (ref 0–149)
VLDL Cholesterol Cal: 33 mg/dL (ref 5–40)

## 2021-12-26 LAB — CBC WITH DIFFERENTIAL/PLATELET
Basophils Absolute: 0 10*3/uL (ref 0.0–0.2)
Basos: 1 %
EOS (ABSOLUTE): 0.2 10*3/uL (ref 0.0–0.4)
Eos: 4 %
Hematocrit: 40.1 % (ref 37.5–51.0)
Hemoglobin: 13.2 g/dL (ref 13.0–17.7)
Immature Grans (Abs): 0 10*3/uL (ref 0.0–0.1)
Immature Granulocytes: 1 %
Lymphocytes Absolute: 1.5 10*3/uL (ref 0.7–3.1)
Lymphs: 35 %
MCH: 26.6 pg (ref 26.6–33.0)
MCHC: 32.9 g/dL (ref 31.5–35.7)
MCV: 81 fL (ref 79–97)
Monocytes Absolute: 0.5 10*3/uL (ref 0.1–0.9)
Monocytes: 12 %
Neutrophils Absolute: 2.1 10*3/uL (ref 1.4–7.0)
Neutrophils: 47 %
Platelets: 230 10*3/uL (ref 150–450)
RBC: 4.97 x10E6/uL (ref 4.14–5.80)
RDW: 15.2 % (ref 11.6–15.4)
WBC: 4.4 10*3/uL (ref 3.4–10.8)

## 2021-12-26 LAB — HEMOGLOBIN A1C
Est. average glucose Bld gHb Est-mCnc: 114 mg/dL
Hgb A1c MFr Bld: 5.6 % (ref 4.8–5.6)

## 2021-12-26 LAB — VITAMIN D 25 HYDROXY (VIT D DEFICIENCY, FRACTURES): Vit D, 25-Hydroxy: 5.6 ng/mL — ABNORMAL LOW (ref 30.0–100.0)

## 2021-12-26 LAB — TSH+FREE T4
Free T4: 1.25 ng/dL (ref 0.82–1.77)
TSH: 3.19 u[IU]/mL (ref 0.450–4.500)

## 2021-12-26 LAB — HEPATITIS C ANTIBODY: Hep C Virus Ab: NONREACTIVE

## 2021-12-31 ENCOUNTER — Other Ambulatory Visit: Payer: Self-pay | Admitting: Family Medicine

## 2021-12-31 DIAGNOSIS — E785 Hyperlipidemia, unspecified: Secondary | ICD-10-CM

## 2021-12-31 DIAGNOSIS — E559 Vitamin D deficiency, unspecified: Secondary | ICD-10-CM

## 2021-12-31 MED ORDER — ROSUVASTATIN CALCIUM 10 MG PO TABS: 10.0000 mg | ORAL_TABLET | Freq: Every day | ORAL | 3 refills | Status: DC

## 2021-12-31 MED ORDER — VITAMIN D (ERGOCALCIFEROL) 1.25 MG (50000 UNIT) PO CAPS: 50000.0000 [IU] | ORAL_CAPSULE | ORAL | 3 refills | Status: DC

## 2021-12-31 NOTE — Progress Notes (Signed)
A prescription for rosuvastatin 10 mg daily and once weekly vitamin D has been sent to your pharmacy.  Your cholesterol levels are elevated, and your vitamin D level is low.  I recommend a low-carb and fat diet and an increased intake of calcium-rich foods and protein-rich foods.  Your kidneys and liver function are stable.

## 2022-01-12 ENCOUNTER — Other Ambulatory Visit: Payer: Self-pay | Admitting: Nurse Practitioner

## 2022-01-22 ENCOUNTER — Ambulatory Visit: Payer: BC Managed Care – PPO | Admitting: Family Medicine

## 2022-02-08 ENCOUNTER — Ambulatory Visit: Payer: BC Managed Care – PPO | Admitting: Family Medicine

## 2022-03-05 NOTE — Progress Notes (Deleted)
Jeffrey Krause, male    DOB: 1955-05-16    MRN: [id number]   Brief patient profile:  78  yobm  active smoker  referred to pulmonary clinic in [place]  11/14/2021 by Jeffrey Krause  for ? Copd       History of Present Illness  11/14/2021  Pulmonary/ 1st office eval/ Jeffrey Krause / [place] [facility] on Anoro and a new "blue pill"  Chief Complaint  Patient presents with   Consult    Ref by PCP for COPD. 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 easier on anoro  Cough: none  Sleep: no resp issues able to lie flat  SABA use: using saba as maint since ran out of anoro, seems to help Rec Plan A = Automatic = Always=    Anoro one click each am  Plan B = Backup (to supplement plan A, not to replace it) Only use your albuterol inhaler as a rescue medication Call with name of the blue pill you take in am  The key is to stop smoking completely before smoking completely stops you! Schedule PFTs > not done as of 03/06/2022  My office will be contacting you by phone for referral for lung cancer screening  > not done as of 03/06/2022  Please schedule a follow up visit in 3 months but call sooner if needed  with all medications /inhalers/ solutions in hand      03/06/2022  f/u ov/[place] [facility]/Jeffrey Krause re: *** maint on ***   still needs pfts/ldsct No chief complaint on file.   Dyspnea:  *** Cough: *** Sleeping: *** SABA use: *** 02: *** Covid status: *** Lung cancer screening: ***   No obvious day to day or daytime variability or assoc excess/ purulent sputum or mucus plugs or hemoptysis or cp or chest tightness, subjective wheeze or overt sinus or hb symptoms.   *** without nocturnal  or early am exacerbation  of respiratory  c/o's or need for noct saba. Also denies any obvious fluctuation of symptoms with weather or environmental changes or other aggravating or alleviating factors except as outlined  above   No unusual exposure hx or h/o childhood pna/ asthma or knowledge of premature birth.  Current Allergies, Complete Past Medical History, Past Surgical History, Family History, and Social History were reviewed in [employer] record.  ROS  The following are not active complaints unless bolded Hoarseness, sore throat, dysphagia, dental problems, itching, sneezing,  nasal congestion or discharge of excess mucus or purulent secretions, ear ache,   fever, chills, sweats, unintended wt loss or wt gain, classically pleuritic or exertional cp,  orthopnea pnd or arm/hand swelling  or leg swelling, presyncope, palpitations, abdominal pain, anorexia, nausea, vomiting, diarrhea  or change in bowel habits or change in bladder habits, change in stools or change in urine, dysuria, hematuria,  rash, arthralgias, visual complaints, headache, numbness, weakness or ataxia or problems with walking or coordination,  change in mood or  memory.        No outpatient medications have been marked as taking for the 03/06/22 encounter (Appointment) with Jeffrey Rockers, MD.                     Past Medical History:  Diagnosis Date   Chronic pain    COPD (chronic obstructive pulmonary disease) ([place])  HTN (hypertension)    Psoriasis    Tobacco use       Objective:     Wt Readings from Last 3 Encounters:  12/25/21 201 lb 0.6 oz (91.2 kg)  11/29/21 210 lb (95.3 kg)  11/27/21 210 lb (95.3 kg)      Vital signs reviewed  03/06/2022  - Note at rest 02 sats  ***% on ***   General appearance:    ***   Min bar***    Assessment       Ci

## 2022-03-06 ENCOUNTER — Ambulatory Visit: Payer: BC Managed Care – PPO | Admitting: Internal Medicine

## 2022-05-21 ENCOUNTER — Telehealth: Payer: Self-pay

## 2022-05-21 ENCOUNTER — Encounter: Payer: Self-pay | Admitting: Family Medicine

## 2022-05-21 ENCOUNTER — Ambulatory Visit (INDEPENDENT_AMBULATORY_CARE_PROVIDER_SITE_OTHER): Payer: BC Managed Care – PPO | Admitting: Family Medicine

## 2022-05-21 VITALS — BP 142/88 | HR 84 | Ht 65.0 in | Wt 198.0 lb

## 2022-05-21 DIAGNOSIS — K219 Gastro-esophageal reflux disease without esophagitis: Secondary | ICD-10-CM | POA: Diagnosis not present

## 2022-05-21 DIAGNOSIS — I10 Essential (primary) hypertension: Secondary | ICD-10-CM | POA: Diagnosis not present

## 2022-05-21 DIAGNOSIS — L309 Dermatitis, unspecified: Secondary | ICD-10-CM

## 2022-05-21 DIAGNOSIS — R7301 Impaired fasting glucose: Secondary | ICD-10-CM

## 2022-05-21 DIAGNOSIS — Z23 Encounter for immunization: Secondary | ICD-10-CM

## 2022-05-21 DIAGNOSIS — J4520 Mild intermittent asthma, uncomplicated: Secondary | ICD-10-CM

## 2022-05-21 DIAGNOSIS — E785 Hyperlipidemia, unspecified: Secondary | ICD-10-CM

## 2022-05-21 DIAGNOSIS — E7849 Other hyperlipidemia: Secondary | ICD-10-CM

## 2022-05-21 DIAGNOSIS — M109 Gout, unspecified: Secondary | ICD-10-CM

## 2022-05-21 DIAGNOSIS — E559 Vitamin D deficiency, unspecified: Secondary | ICD-10-CM

## 2022-05-21 DIAGNOSIS — Z720 Tobacco use: Secondary | ICD-10-CM

## 2022-05-21 DIAGNOSIS — E0789 Other specified disorders of thyroid: Secondary | ICD-10-CM

## 2022-05-21 MED ORDER — LOSARTAN POTASSIUM 25 MG PO TABS: 25.0000 mg | ORAL_TABLET | Freq: Every day | ORAL | 1 refills | Status: DC

## 2022-05-21 MED ORDER — MUPIROCIN 2 % EX OINT: 1.0000 | TOPICAL_OINTMENT | Freq: Two times a day (BID) | CUTANEOUS | 0 refills | Status: DC

## 2022-05-21 MED ORDER — ALBUTEROL SULFATE HFA 108 (90 BASE) MCG/ACT IN AERS: 2.0000 | INHALATION_SPRAY | Freq: Four times a day (QID) | RESPIRATORY_TRACT | 2 refills | Status: DC | PRN

## 2022-05-21 MED ORDER — ROSUVASTATIN CALCIUM 10 MG PO TABS: 10.0000 mg | ORAL_TABLET | Freq: Every day | ORAL | 3 refills | Status: DC

## 2022-05-21 MED ORDER — BETAMETHASONE DIPROPIONATE 0.05 % EX CREA: TOPICAL_CREAM | Freq: Two times a day (BID) | CUTANEOUS | 0 refills | Status: DC

## 2022-05-21 MED ORDER — PANTOPRAZOLE SODIUM 40 MG PO TBEC: 40.0000 mg | DELAYED_RELEASE_TABLET | Freq: Every day | ORAL | 2 refills | Status: DC

## 2022-05-21 NOTE — Progress Notes (Unsigned)
Established Patient Office Visit  Subjective:  Patient ID: Jeffrey Krause, male    DOB: [birth year]  Age: 67 y.o. MRN: [id number]  CC:  Chief Complaint  Patient presents with   Follow-up    Last visit was 12/26/22, pt reports hand pain and skin splitting on both hands since 2 weeks 05/07/2022. Did have the flu around that time frame.     HPI Jeffrey Krause is a 67 y.o. male with past medical history of hypertension, hyperlipidemia, and GERD presents for f/u of  chronic medical conditions. For the details of today's visit, please refer to the assessment and plan.     Past Medical History:  Diagnosis Date   Chronic pain    COPD (chronic obstructive pulmonary disease)    HTN (hypertension)    Psoriasis    Tobacco use     History reviewed. No pertinent surgical history.  Family History  Problem Relation Age of Onset   Diabetes type II Father     Social History   Socioeconomic History   Marital status: Single    Spouse name: Not on file   Number of children: 15   Years of education: Not on file   Highest education level: Not on file  Occupational History   Not on file  Tobacco Use   Smoking status: Every Day    Packs/day: 0.25    Years: 42.00    Additional pack years: 0.00    Total pack years: 10.50    Types: Cigarettes   Smokeless tobacco: Not on file  Substance and Sexual Activity   Alcohol use: Yes    Alcohol/week: 2.0 standard drinks of alcohol    Types: 2 Shots of liquor per week    Comment: a day   Drug use: No   Sexual activity: Not Currently  Other Topics Concern   Not on file  Social History Narrative   Not on file   Social Determinants of Health   Financial Resource Strain: Not on file  Food Insecurity: Not on file  Transportation Needs: Not on file  Physical Activity: Not on file  Stress: Not on file  Social Connections: Not on file  Intimate Partner Violence: Not on file    Outpatient Medications Prior to Visit  Medication Sig  Dispense Refill   acetaminophen (TYLENOL) 325 MG tablet Take 2 tablets (650 mg total) by mouth every 6 (six) hours as needed for mild pain, moderate pain or fever. 12 tablet 0   Ibuprofen (ADVIL PO) Take by mouth. As needed     naproxen (NAPROSYN) 500 MG tablet Take 1 tablet (500 mg total) by mouth 2 (two) times daily. 30 tablet 0   naproxen sodium (ALEVE) 220 MG tablet Take 220 mg by mouth. As needed     umeclidinium-vilanterol (ANORO ELLIPTA) 62.5-25 MCG/ACT AEPB Inhale 1 puff into the lungs daily. 1 each 11   UNABLE TO FIND Blood pressure cuff x 1  DX I10 1 each 0   Vitamin D, Ergocalciferol, (DRISDOL) 1.25 MG (50000 UNIT) CAPS capsule Take 1 capsule (50,000 Units total) by mouth every 7 (seven) days. 5 capsule 3   losartan (COZAAR) 25 MG tablet TAKE 1 TABLET (25 MG TOTAL) BY MOUTH DAILY. 90 tablet 0   rosuvastatin (CRESTOR) 10 MG tablet Take 1 tablet (10 mg total) by mouth daily. 90 tablet 3   allopurinol (ZYLOPRIM) 300 MG tablet Take 1 tablet (300 mg total) by mouth daily. (Patient not taking: Reported on 05/21/2022) 30  tablet 5   colchicine 0.6 MG tablet One by mouth three times a day for five days for gout pain. (Patient not taking: Reported on 05/21/2022) 15 tablet 3   oxyCODONE (ROXICODONE) 5 MG immediate release tablet Take 1 tablet (5 mg total) by mouth every 4 (four) hours as needed for severe pain. (Patient not taking: Reported on 05/21/2022) 10 tablet 0   oxyCODONE-acetaminophen (PERCOCET) 5-325 MG tablet Take 1 tablet by mouth every 4 (four) hours as needed for moderate pain. (Patient not taking: Reported on 05/21/2022) 6 tablet 0   predniSONE (DELTASONE) 50 MG tablet Take 50 mg by mouth daily with breakfast. (Patient not taking: Reported on 05/21/2022)     albuterol (VENTOLIN HFA) 108 (90 Base) MCG/ACT inhaler Inhale 2 puffs into the lungs every 6 (six) hours as needed for wheezing or shortness of breath. (Patient not taking: Reported on 05/21/2022) 1 each 2   pantoprazole (PROTONIX) 40 MG  tablet Take 1 tablet (40 mg total) by mouth daily. (Patient not taking: Reported on 05/21/2022) 30 tablet 0   No facility-administered medications prior to visit.    No Known Allergies  ROS Review of Systems  Constitutional:  Negative for fatigue and fever.  Eyes:  Negative for visual disturbance.  Respiratory:  Negative for chest tightness and shortness of breath.   Cardiovascular:  Negative for chest pain and palpitations.  Musculoskeletal:        Bilateral hand pain  Neurological:  Negative for dizziness and headaches.      Objective:    Physical Exam HENT:     Head: Normocephalic.     Right Ear: External ear normal.     Left Ear: External ear normal.     Nose: No congestion or rhinorrhea.     Mouth/Throat:     Mouth: Mucous membranes are moist.  Cardiovascular:     Rate and Rhythm: Regular rhythm.     Heart sounds: No murmur heard. Pulmonary:     Effort: No respiratory distress.     Breath sounds: Normal breath sounds.  Skin:    Comments:  Hyperkeratosis, lichenifications and fissured of the dorsal and palmer of  the hands  Neurological:     Mental Status: He is alert.     BP (!) 142/88 (BP Location: Left Arm)   Pulse 84   Ht 5\' 5"  (1.651 m)   Wt 198 lb (89.8 kg)   SpO2 96%   BMI 32.95 kg/m  Wt Readings from Last 3 Encounters:  05/21/22 198 lb (89.8 kg)  12/25/21 201 lb 0.6 oz (91.2 kg)  11/29/21 210 lb (95.3 kg)    Lab Results  Component Value Date   TSH 2.750 05/21/2022   Lab Results  Component Value Date   WBC 7.3 05/21/2022   HGB 11.9 (L) 05/21/2022   HCT 36.8 (L) 05/21/2022   MCV 80 05/21/2022   PLT 557 (H) 05/21/2022   Lab Results  Component Value Date   NA 140 05/21/2022   K 5.0 05/21/2022   CO2 18 (L) 05/21/2022   GLUCOSE 94 05/21/2022   BUN 19 05/21/2022   CREATININE 1.21 05/21/2022   BILITOT 0.3 05/21/2022   ALKPHOS 157 (H) 05/21/2022   AST 26 05/21/2022   ALT 26 05/21/2022   PROT 6.6 05/21/2022   ALBUMIN 4.0 05/21/2022    CALCIUM 9.3 05/21/2022   ANIONGAP 7 01/09/2021   EGFR 67 05/21/2022   Lab Results  Component Value Date   CHOL 235 (H) 05/21/2022  Lab Results  Component Value Date   HDL 53 05/21/2022   Lab Results  Component Value Date   LDLCALC 143 (H) 05/21/2022   Lab Results  Component Value Date   TRIG 218 (H) 05/21/2022   Lab Results  Component Value Date   CHOLHDL 4.4 05/21/2022   Lab Results  Component Value Date   HGBA1C 5.9 (H) 05/21/2022      Assessment & Plan:  Primary hypertension Assessment & Plan: He takes losartan 25 mg daily He reports that he has been out of his medication for several weeks due to cost We will place a referral in today to pharmacy for medication management Patient is asymptomatic today in the clinic Will follow-up on BP in 2 weeks BP Readings from Last 3 Encounters:  05/21/22 (!) 142/88  12/25/21 (!) 156/90  11/29/21 (!) 169/88      Orders: -     AMB Referral to Pharmacy Medication Management -     Losartan Potassium; Take 1 tablet (25 mg total) by mouth daily.  Dispense: 90 tablet; Refill: 1  Chronic eczema of hand Assessment & Plan: Complains of hand pain, swelling and skin splitting Hyperkeratosis, lichenifications and fissured of the dorsal and palmer of  the hands Denies contact with irritants Reports frequent handwashing at work Pain wakes him up at night Encouraged her to over-the-counter Tylenol as needed for pain Reports history of atopic dermatitis We will treat today with a high potency corticosteroid, topical antibiotic cream to prevent infection and a referral to dermatology  Orders: -     Ambulatory referral to Dermatology -     Mupirocin; Apply 1 Application topically 2 (two) times daily.  Dispense: 22 g; Refill: 0 -     Betamethasone Dipropionate; Apply topically 2 (two) times daily.  Dispense: 30 g; Refill: 0  Gastroesophageal reflux disease without esophagitis Assessment & Plan: Refilled Protonix 40  mg  Orders: -     Pantoprazole Sodium; Take 1 tablet (40 mg total) by mouth daily.  Dispense: 30 tablet; Refill: 2  Other hyperlipidemia -     CBC with Differential/Platelet -     CMP14+EGFR -     Lipid panel  Immunization due -     Tdap vaccine greater than or equal to 7yo IM  Hyperlipidemia LDL goal <100 -     Rosuvastatin Calcium; Take 1 tablet (10 mg total) by mouth daily.  Dispense: 90 tablet; Refill: 3  Vitamin D deficiency -     VITAMIN D 25 Hydroxy (Vit-D Deficiency, Fractures)  Tobacco use  IFG (impaired fasting glucose) -     Hemoglobin A1c  Other specified disorders of thyroid -     TSH + free T4  Gout, unspecified cause, unspecified chronicity, unspecified site  Mild intermittent asthma without complication -     Albuterol Sulfate HFA; Inhale 2 puffs into the lungs every 6 (six) hours as needed for wheezing or shortness of breath.  Dispense: 8 g; Refill: 2    Follow-up: Return in about 2 weeks (around 06/04/2022) for BP.   [name], FNP

## 2022-05-21 NOTE — Patient Instructions (Addendum)
I appreciate the opportunity to provide care to you today!    Follow up:  2 weeks for BP  Labs: please stop by the lab today to get your blood drawn (CBC, CMP, TSH, Lipid profile, HgA1c, Vit D)  Please pick up your medications at the pharmacy  Thank you for getting your Tdap vaccine  Referrals today- Dermatology and Pharmacy   Please complete your FIT Test and drop it off at the lab   Please continue to a heart-healthy diet and increase your physical activities. Try to exercise for at least five days a week.      It was a pleasure to see you and I look forward to continuing to work together on your health and well-being. Please do not hesitate to call the office if you need care or have questions about your care.   Have a wonderful day and week. With Gratitude, [name] MSN, FNP-BC

## 2022-05-21 NOTE — Progress Notes (Signed)
   Care Guide Note  05/21/2022 Name: DECORIUS OUTWATER MRN: [id number] DOB: July 30, 1955  Referred by: [name], FNP Reason for referral : Care Coordination (Outreach to schedule with Pharm d )   JAMIEN KORAB is a 67 y.o. year old male who is a primary care patient of [name], FNP. [name] was referred to the pharmacist for assistance related to HTN.    An unsuccessful telephone outreach was attempted today to contact the patient who was referred to the pharmacy team for assistance with medication assistance. Additional attempts will be made to contact the patient.   Penne Lash, RMA Care Guide [facility]  [place], [place] [address] Direct Dial: [phone] [email or web address]

## 2022-05-22 DIAGNOSIS — L309 Dermatitis, unspecified: Secondary | ICD-10-CM | POA: Insufficient documentation

## 2022-05-22 DIAGNOSIS — K219 Gastro-esophageal reflux disease without esophagitis: Secondary | ICD-10-CM | POA: Insufficient documentation

## 2022-05-22 LAB — LIPID PANEL
Chol/HDL Ratio: 4.4 ratio (ref 0.0–5.0)
Cholesterol, Total: 235 mg/dL — ABNORMAL HIGH (ref 100–199)
HDL: 53 mg/dL (ref >39)
LDL Chol Calc (NIH): 143 mg/dL — ABNORMAL HIGH (ref 0–99)
Triglycerides: 218 mg/dL — ABNORMAL HIGH (ref 0–149)
VLDL Cholesterol Cal: 39 mg/dL (ref 5–40)

## 2022-05-22 LAB — CMP14+EGFR
ALT: 26 IU/L (ref 0–44)
AST: 26 IU/L (ref 0–40)
Albumin/Globulin Ratio: 1.5 (ref 1.2–2.2)
Albumin: 4 g/dL (ref 3.9–4.9)
Alkaline Phosphatase: 157 IU/L — ABNORMAL HIGH (ref 44–121)
BUN/Creatinine Ratio: 16 (ref 10–24)
BUN: 19 mg/dL (ref 8–27)
Bilirubin Total: 0.3 mg/dL (ref 0.0–1.2)
CO2: 18 mmol/L — ABNORMAL LOW (ref 20–29)
Calcium: 9.3 mg/dL (ref 8.6–10.2)
Chloride: 107 mmol/L — ABNORMAL HIGH (ref 96–106)
Creatinine, Ser: 1.21 mg/dL (ref 0.76–1.27)
Globulin, Total: 2.6 g/dL (ref 1.5–4.5)
Glucose: 94 mg/dL (ref 70–99)
Potassium: 5 mmol/L (ref 3.5–5.2)
Sodium: 140 mmol/L (ref 134–144)
Total Protein: 6.6 g/dL (ref 6.0–8.5)
eGFR: 67 mL/min/{1.73_m2} (ref >59)

## 2022-05-22 LAB — CBC WITH DIFFERENTIAL/PLATELET
Basophils Absolute: 0.1 10*3/uL (ref 0.0–0.2)
Basos: 1 %
EOS (ABSOLUTE): 0.4 10*3/uL (ref 0.0–0.4)
Eos: 5 %
Hematocrit: 36.8 % — ABNORMAL LOW (ref 37.5–51.0)
Hemoglobin: 11.9 g/dL — ABNORMAL LOW (ref 13.0–17.7)
Immature Grans (Abs): 0.1 10*3/uL (ref 0.0–0.1)
Immature Granulocytes: 1 %
Lymphocytes Absolute: 2.4 10*3/uL (ref 0.7–3.1)
Lymphs: 33 %
MCH: 25.8 pg — ABNORMAL LOW (ref 26.6–33.0)
MCHC: 32.3 g/dL (ref 31.5–35.7)
MCV: 80 fL (ref 79–97)
Monocytes Absolute: 1 10*3/uL — ABNORMAL HIGH (ref 0.1–0.9)
Monocytes: 14 %
Neutrophils Absolute: 3.4 10*3/uL (ref 1.4–7.0)
Neutrophils: 46 %
Platelets: 557 10*3/uL — ABNORMAL HIGH (ref 150–450)
RBC: 4.62 x10E6/uL (ref 4.14–5.80)
RDW: 16.8 % — ABNORMAL HIGH (ref 11.6–15.4)
WBC: 7.3 10*3/uL (ref 3.4–10.8)

## 2022-05-22 LAB — TSH+FREE T4
Free T4: 1.01 ng/dL (ref 0.82–1.77)
TSH: 2.75 u[IU]/mL (ref 0.450–4.500)

## 2022-05-22 LAB — HEMOGLOBIN A1C
Est. average glucose Bld gHb Est-mCnc: 123 mg/dL
Hgb A1c MFr Bld: 5.9 % — ABNORMAL HIGH (ref 4.8–5.6)

## 2022-05-22 LAB — VITAMIN D 25 HYDROXY (VIT D DEFICIENCY, FRACTURES): Vit D, 25-Hydroxy: 6 ng/mL — ABNORMAL LOW (ref 30.0–100.0)

## 2022-05-22 NOTE — Assessment & Plan Note (Addendum)
Complains of hand pain, swelling and skin splitting Hyperkeratosis, lichenifications and fissured of the dorsal and palmer of  the hands Denies contact with irritants Reports frequent handwashing at work Pain wakes him up at night Encouraged her to over-the-counter Tylenol as needed for pain Reports history of atopic dermatitis We will treat today with a high potency corticosteroid, topical antibiotic cream to prevent infection and a referral to dermatology

## 2022-05-22 NOTE — Assessment & Plan Note (Signed)
-   Refilled Protonix 40 mg  °

## 2022-05-22 NOTE — Assessment & Plan Note (Addendum)
He takes losartan 25 mg daily He reports that he has been out of his medication for several weeks due to cost We will place a referral in today to pharmacy for medication management Patient is asymptomatic today in the clinic Will follow-up on BP in 2 weeks BP Readings from Last 3 Encounters:  05/21/22 (!) 142/88  12/25/21 (!) 156/90  11/29/21 (!) 169/88

## 2022-05-23 ENCOUNTER — Telehealth: Payer: Self-pay | Admitting: Family Medicine

## 2022-05-23 NOTE — Telephone Encounter (Signed)
Called patient left voicemail to contact our office to schedule a 2 week follow up to eval blood pressure per provider [name].

## 2022-05-30 ENCOUNTER — Other Ambulatory Visit: Payer: Self-pay | Admitting: Family Medicine

## 2022-05-30 DIAGNOSIS — E785 Hyperlipidemia, unspecified: Secondary | ICD-10-CM

## 2022-05-30 DIAGNOSIS — D508 Other iron deficiency anemias: Secondary | ICD-10-CM

## 2022-05-30 DIAGNOSIS — E559 Vitamin D deficiency, unspecified: Secondary | ICD-10-CM

## 2022-05-30 MED ORDER — ROSUVASTATIN CALCIUM 20 MG PO TABS: 20.0000 mg | ORAL_TABLET | Freq: Every day | ORAL | 3 refills | Status: DC

## 2022-05-30 MED ORDER — VITAMIN D (ERGOCALCIFEROL) 1.25 MG (50000 UNIT) PO CAPS: 50000.0000 [IU] | ORAL_CAPSULE | ORAL | 3 refills | Status: DC

## 2022-05-30 NOTE — Progress Notes (Signed)
A weekly vitamin D supplement prescription has been sent to your pharmacy because your vitamin D is low. Your cholesterol levels are elevated. I want your LDL to be less than 100 I recommend avoiding simple carbohydrates including cakes, sweet desserts, ice cream, soda (diet or regular), sweet tea, candies, chips, cookies, store-bought juices, alcohol in excess of 1-2 drinks a day, lemonade, artificial sweeteners, donuts, coffee creamers, and sugar-free products.  I recommend avoiding greasy, fatty foods with increased physical activity. A prescription for rosuvastatin 20 mg has been sent to your pharmacy to help decrease your cholesterol levels. You are prediabetic, I recommend reducing your intake of foods high in sugar. Your thyroid, kidneys, and liver function are stable. Please return for labs to assess your iron levels.  Your hemoglobin is slighly low.I recommend increasing your intake of iron-rich foods such as Whole-wheat breads, cereals,  bagels, Dried apricots, dried figs, Spinach, soybeans, Asparagus, Brussels sprouts, mushrooms, green peas, white or sweet potato with the skin, tomato sauce, beets, beans such as garbanzo or lima, Greens such as collard, turnip, kale, beet, or Swiss chard. Beef, veal, lamb, pork, beef or chicken liver, clams, sardines, oysters, shrimp, tofu, baked beans with pork, lentils, tahini, tempeh. Beans such as kidney, lima, navy, or white.Chicken breast, [place], eggs, fresh or canned tuna or mackerel.Molasses, soy milk, seeds such as sesame or sunflower. Nuts such as almonds, pistachios, cashews, and walnuts.Pumpkin seeds.

## 2022-05-30 NOTE — Progress Notes (Signed)
The 10-year ASCVD risk score (Arnett DK, et al., 2019) is: 30.8%   Values used to calculate the score:     Age: 67 years     Sex: Male     Is Non-Hispanic African American: Yes     Diabetic: No     Tobacco smoker: Yes     Systolic Blood Pressure: 142 mmHg     Is BP treated: Yes     HDL Cholesterol: 53 mg/dL     Total Cholesterol: 235 mg/dL

## 2022-05-30 NOTE — Progress Notes (Deleted)
Jeffrey Krause, male    DOB: [birth year]    MRN: [id number]   Brief patient profile:  31  yobm  active smoker  referred to pulmonary clinic in [place]  11/14/2021 by Jeffrey Krause  for ? Copd       History of Present Illness  11/14/2021  Pulmonary/ 1st office eval/ Jeffrey Krause / [facility] on Anoro and a new "blue pill"  Chief Complaint  Patient presents with   Consult    Ref by PCP for COPD. 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 easier on anoro  Cough: none  Sleep: no resp issues able to lie flat  SABA use: using saba as maint since ran out of anoro, seems to help Rec Plan A = Automatic = Always=    Anoro one click each am  Plan B = Backup (to supplement plan A, not to replace it) Only use your albuterol inhaler as a rescue medication Call with name of the blue pill you take in am  The key is to stop smoking completely before smoking completely stops you! Schedule PFTs  My office will be contacting you by phone for referral for lung cancer screening   Please schedule a follow up visit in 3 months but call sooner if needed  with all medications /inhalers/ solutions in hand      06/01/2022  f/u ov/ office/Jeffrey Krause re: doe /?copd  maint on ***  No chief complaint on file.   Dyspnea:  *** Cough: *** Sleeping: *** SABA use: *** 02: *** Covid status: *** Lung cancer screening: ***   No obvious day to day or daytime variability or assoc excess/ purulent sputum or mucus plugs or hemoptysis or cp or chest tightness, subjective wheeze or overt sinus or hb symptoms.   *** without nocturnal  or early am exacerbation  of respiratory  c/o's or need for noct saba. Also denies any obvious fluctuation of symptoms with weather or environmental changes or other aggravating or alleviating factors except as outlined above   No unusual exposure hx or h/o childhood pna/ asthma or  knowledge of premature birth.  Current Allergies, Complete Past Medical History, Past Surgical History, Family History, and Social History were reviewed in [employer] record.  ROS  The following are not active complaints unless bolded Hoarseness, sore throat, dysphagia, dental problems, itching, sneezing,  nasal congestion or discharge of excess mucus or purulent secretions, ear ache,   fever, chills, sweats, unintended wt loss or wt gain, classically pleuritic or exertional cp,  orthopnea pnd or arm/hand swelling  or leg swelling, presyncope, palpitations, abdominal pain, anorexia, nausea, vomiting, diarrhea  or change in bowel habits or change in bladder habits, change in stools or change in urine, dysuria, hematuria,  rash, arthralgias, visual complaints, headache, numbness, weakness or ataxia or problems with walking or coordination,  change in mood or  memory.        No outpatient medications have been marked as taking for the 06/01/22 encounter (Appointment) with Jeffrey Cowden, MD.            Past Medical History:  Diagnosis Date   Chronic pain    COPD (chronic obstructive pulmonary disease) (HCC)    HTN (hypertension)    Psoriasis    Tobacco use  Objective:     Wt Readings from Last 3 Encounters:  05/21/22 198 lb (89.8 kg)  12/25/21 201 lb 0.6 oz (91.2 kg)  11/29/21 210 lb (95.3 kg)      Vital signs reviewed  06/01/2022  - Note at rest 02 sats  ***% on ***   General appearance:    ***     Min bar***    Assessment

## 2022-05-31 NOTE — Progress Notes (Signed)
   Care Guide Note  05/31/2022 Name: Jeffrey Krause MRN: [id number] DOB: May 18, 1955  Referred by: [name], FNP Reason for referral : Care Coordination (Outreach to schedule with Pharm d )   Jeffrey Krause is a 67 y.o. year old male who is a primary care patient of [name], FNP. Jeffrey Krause was referred to the pharmacist for assistance related to HTN.    Successful contact was made with the patient to discuss pharmacy services including being ready for the pharmacist to call at least 5 minutes before the scheduled appointment time, to have medication bottles and any blood sugar or blood pressure readings ready for review. The patient agreed to meet with the pharmacist via with the pharmacist via telephone visit on (date/time).  06/06/2022  Penne Lash, RMA Care Guide [facility]  [place], [place] [address] Direct Dial: [phone] [email or web address]

## 2022-06-01 ENCOUNTER — Ambulatory Visit: Payer: BC Managed Care – PPO | Admitting: Internal Medicine

## 2022-06-03 IMAGING — US US ABDOMEN LIMITED
1 series · 14 of 25 positions shown · non-contrast
Comparison: CT yesterday.

CLINICAL DATA: Cirrhosis questioned on CT scan of the chest.

EXAM:
ULTRASOUND ABDOMEN LIMITED RIGHT UPPER QUADRANT

[Series 1: us abdomen limited ruq (liver/gb) · 14 of 81 slices shown]
[im 1/81]
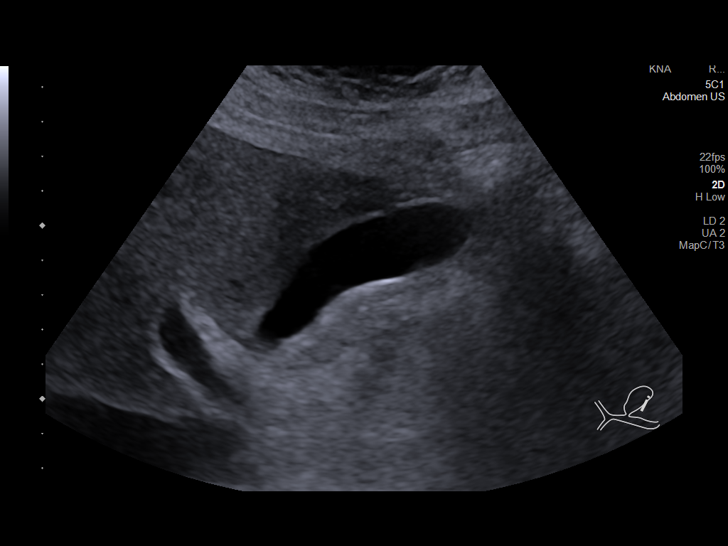
[im 7/81]
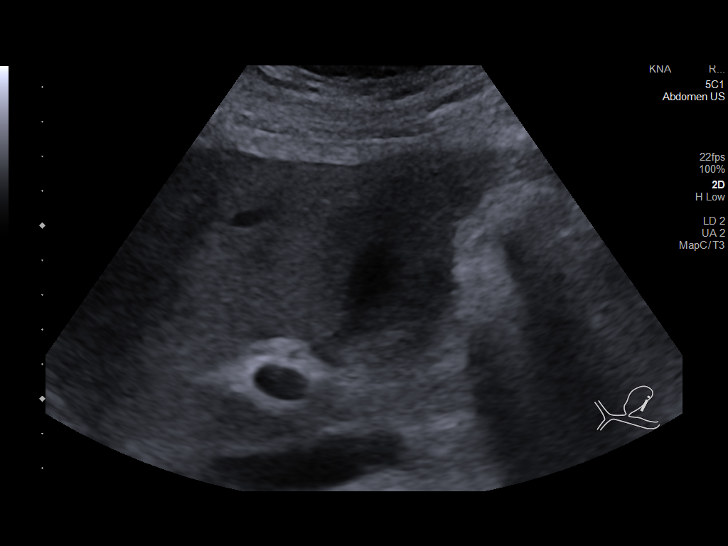
[im 14/81]
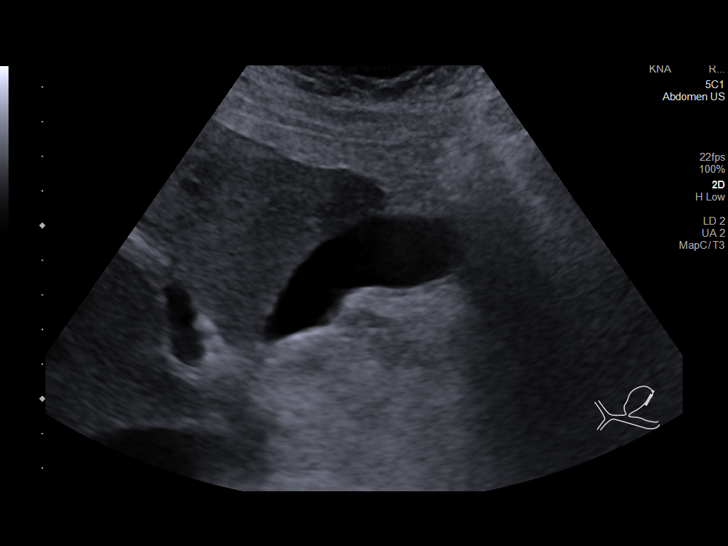
[im 21/81]
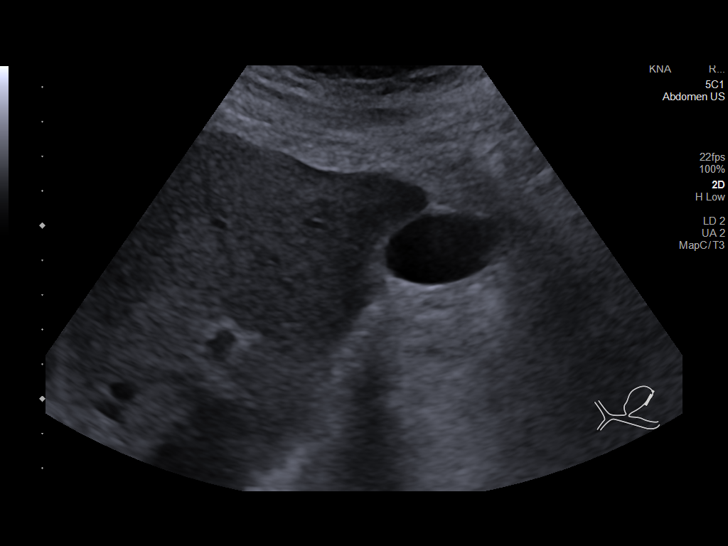
[im 27/81]
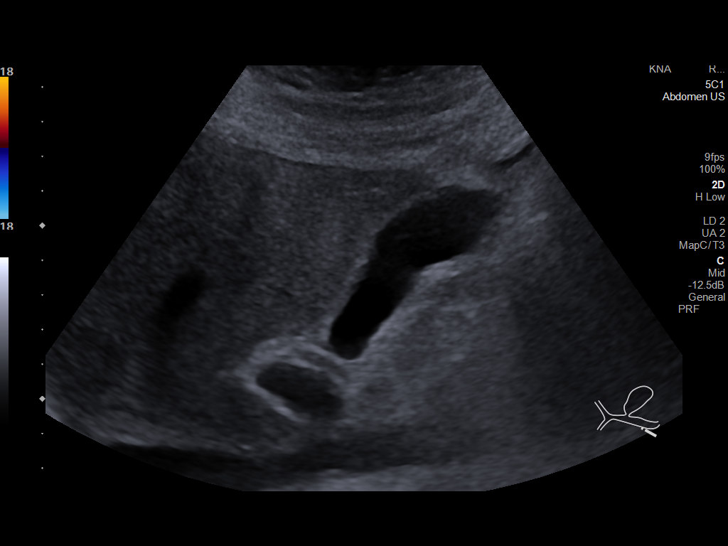
[im 31/81]
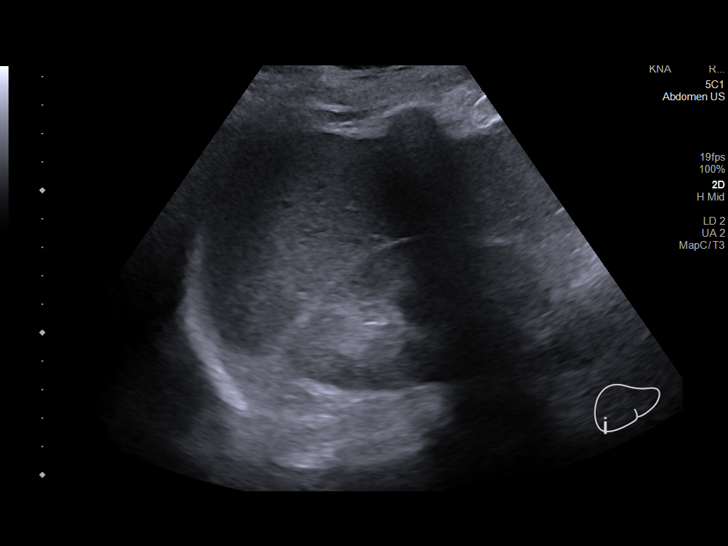
[im 37/81]
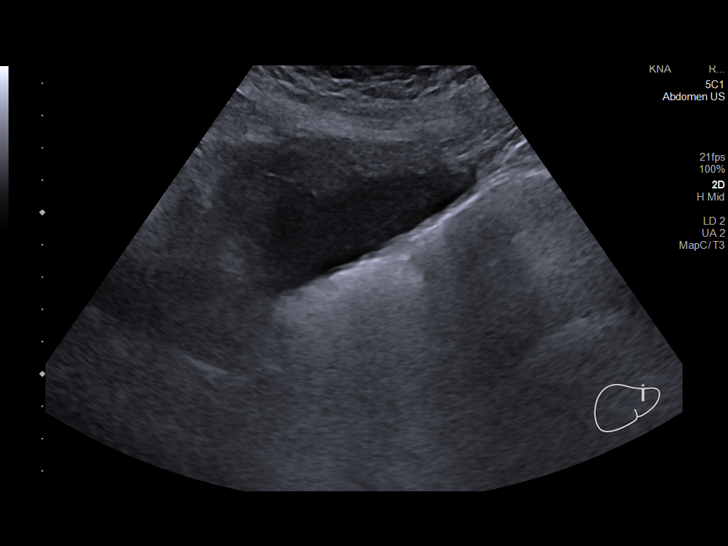
[im 44/81]
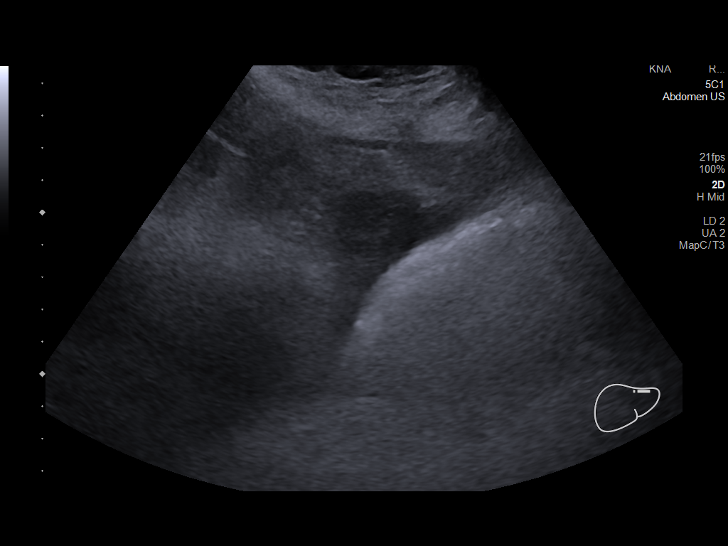
[im 51/81]
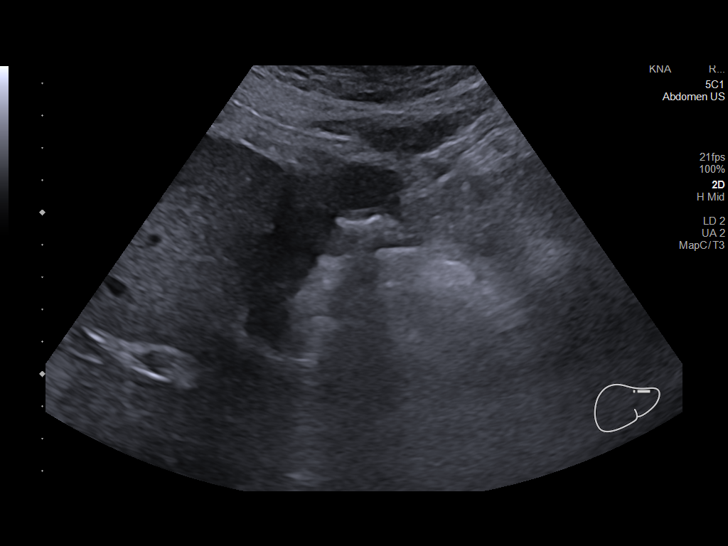
[im 54/81]
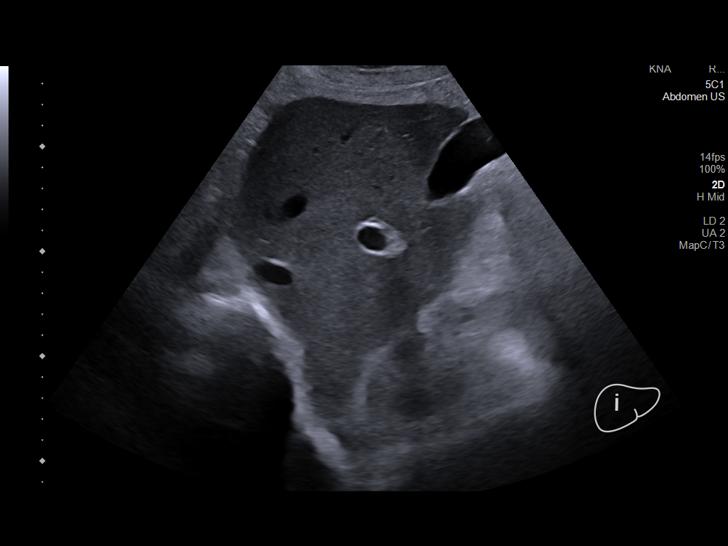
[im 61/81]
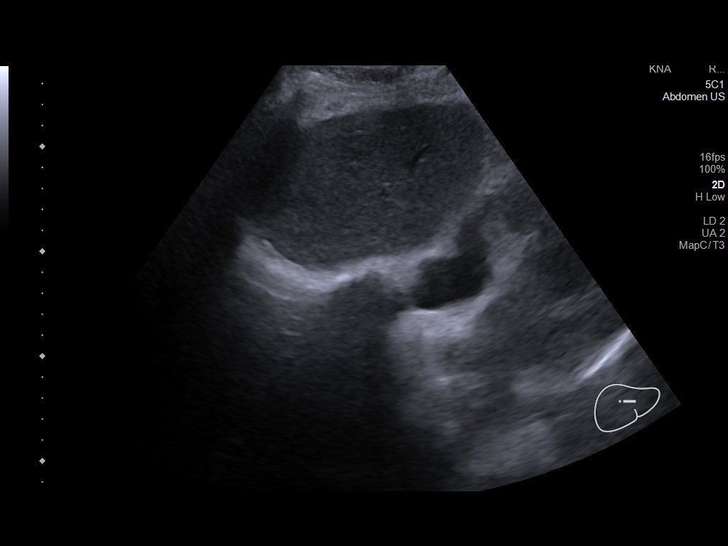
[im 67/81]
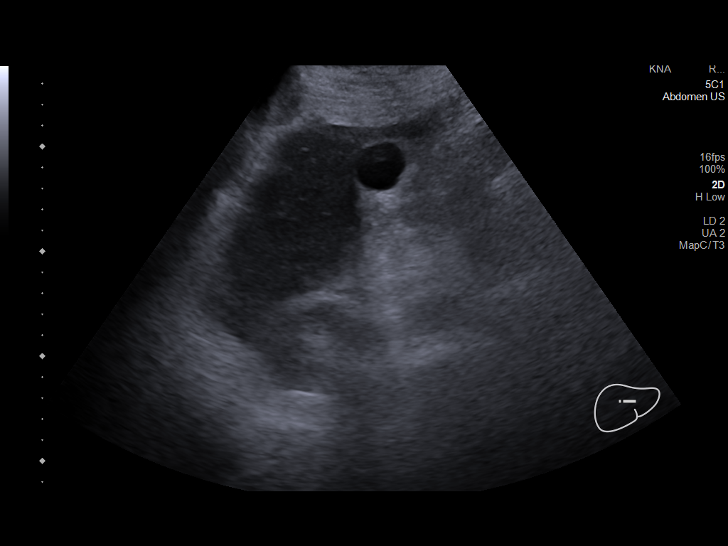
[im 74/81]
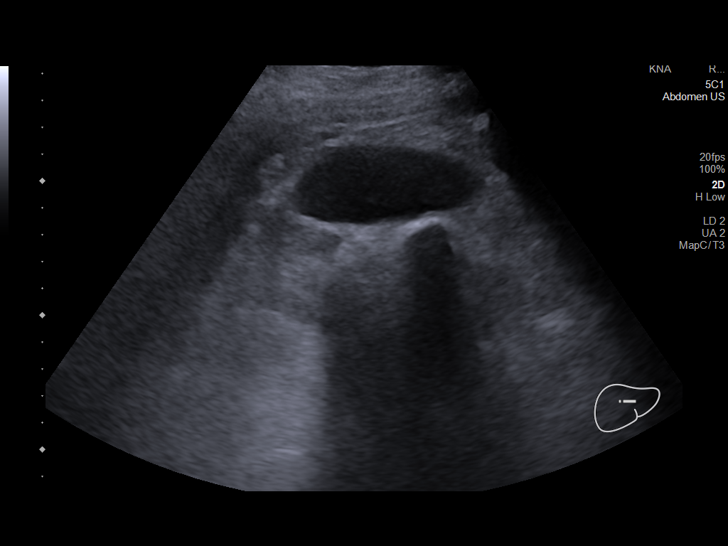
[im 81/81]
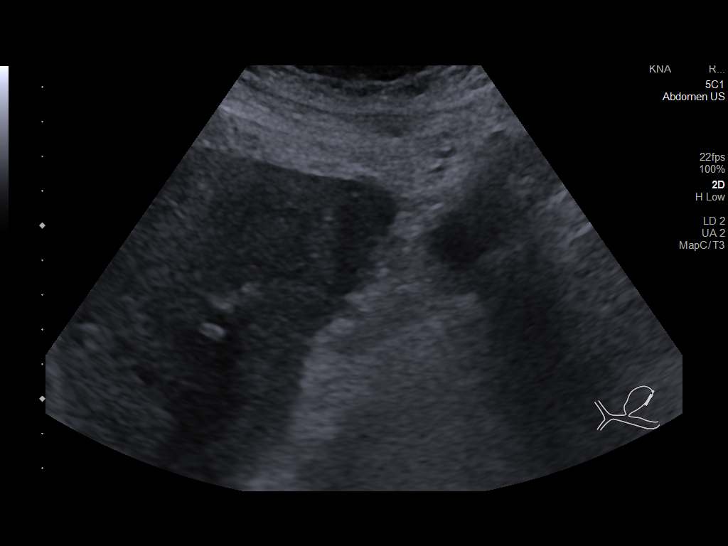

[14 of 25 positions shown; findings below may reference images not displayed]

FINDINGS: Gallbladder:

No gallstones or wall thickening visualized. No sonographic Murphy
sign noted by sonographer.

Common bile duct:

Diameter: 3.9 mm, normal

Liver:

Equivocal minimal increased echogenicity of the liver parenchyma.
Very minimal nodular configuration of the surface of the liver. As
with ultrasound, the findings are suggestive but not conclusive for
diagnosis of cirrhosis. Portal vein is patent on color Doppler
imaging with normal direction of blood flow towards the liver.

Other: None.
IMPRESSION: Ultrasound suggests minimal increased echogenicity of the liver with
minimal micro nodular surface contour. As with CT, the findings are
suggestive but not conclusive for the diagnosis of cirrhosis in the
early stages.

## 2022-06-03 IMAGING — DX DG SHOULDER 2+V*L*
4 series · 4 of 4 positions shown · non-contrast
Comparison: None.

CLINICAL DATA: Chronic left shoulder pain

EXAM:
LEFT SHOULDER - 2+ VIEW

[shoulder grashey (1 of 2)]
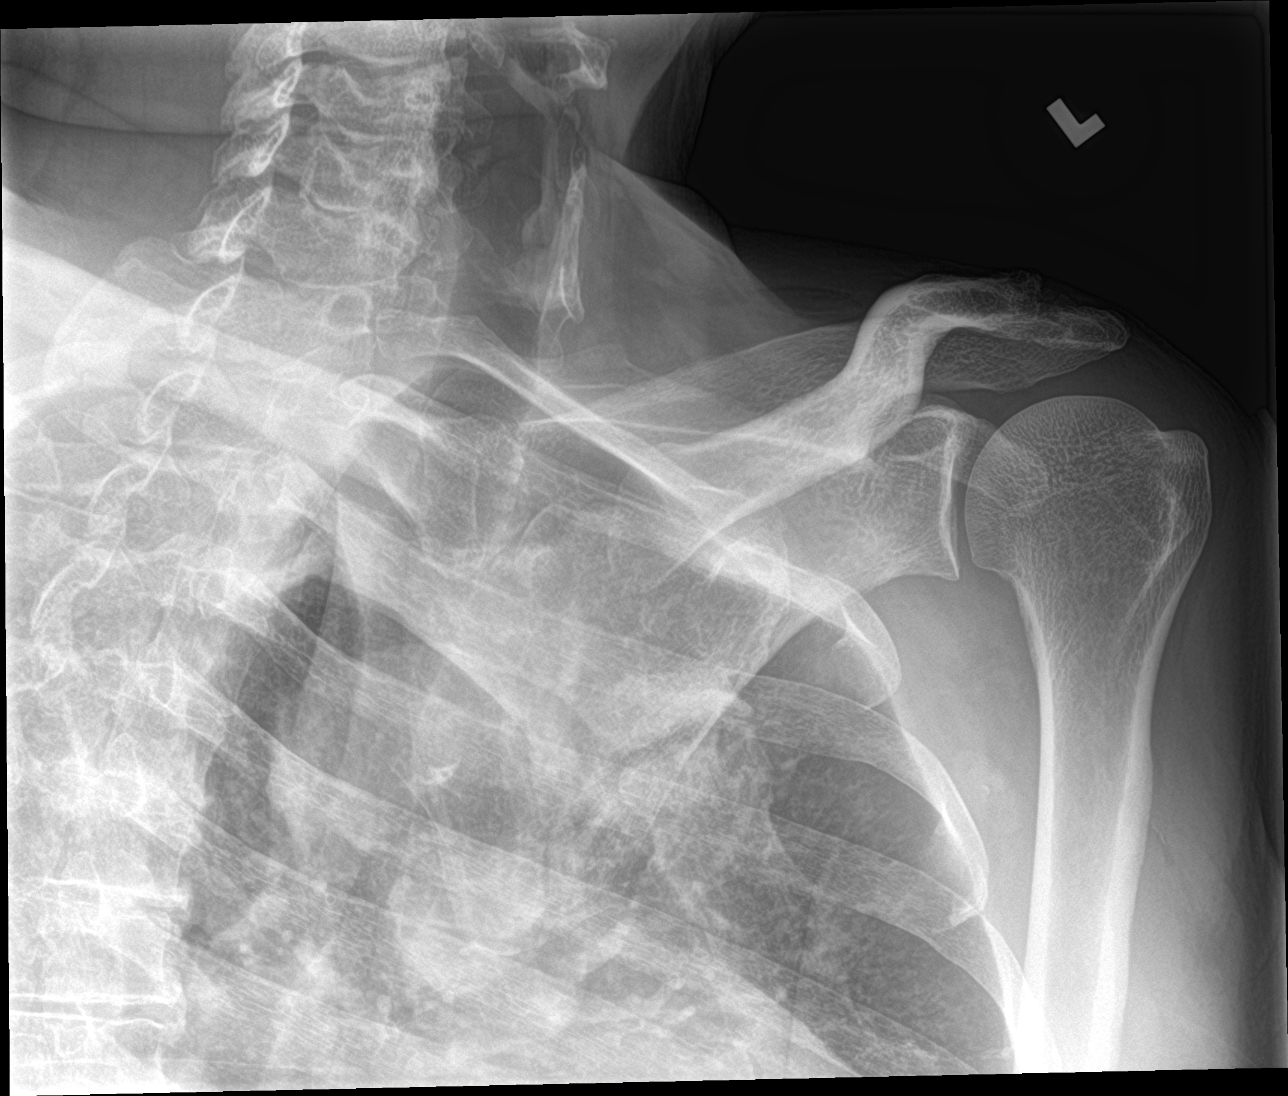

[shoulder y view]
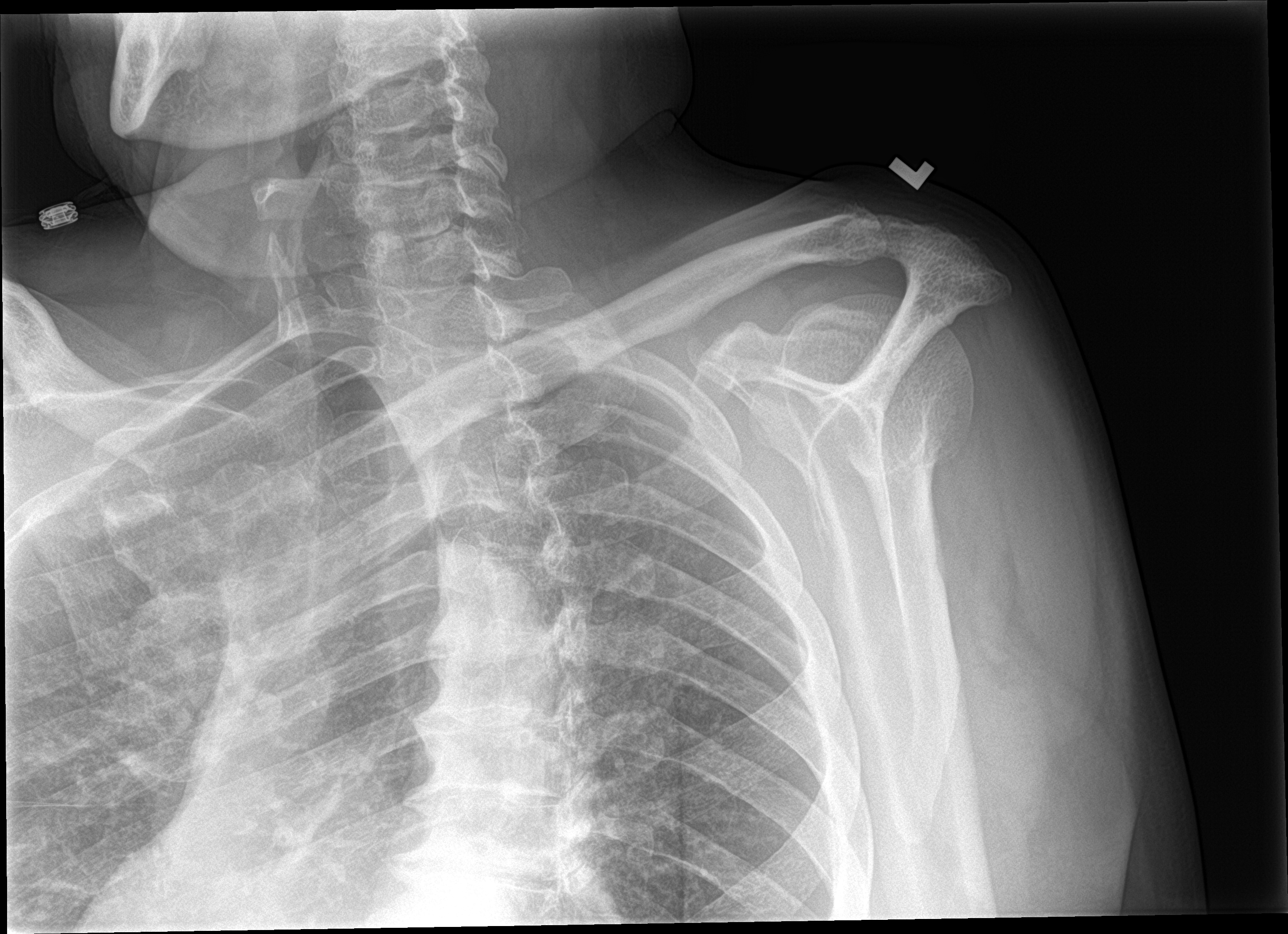

[shoulder axillary]
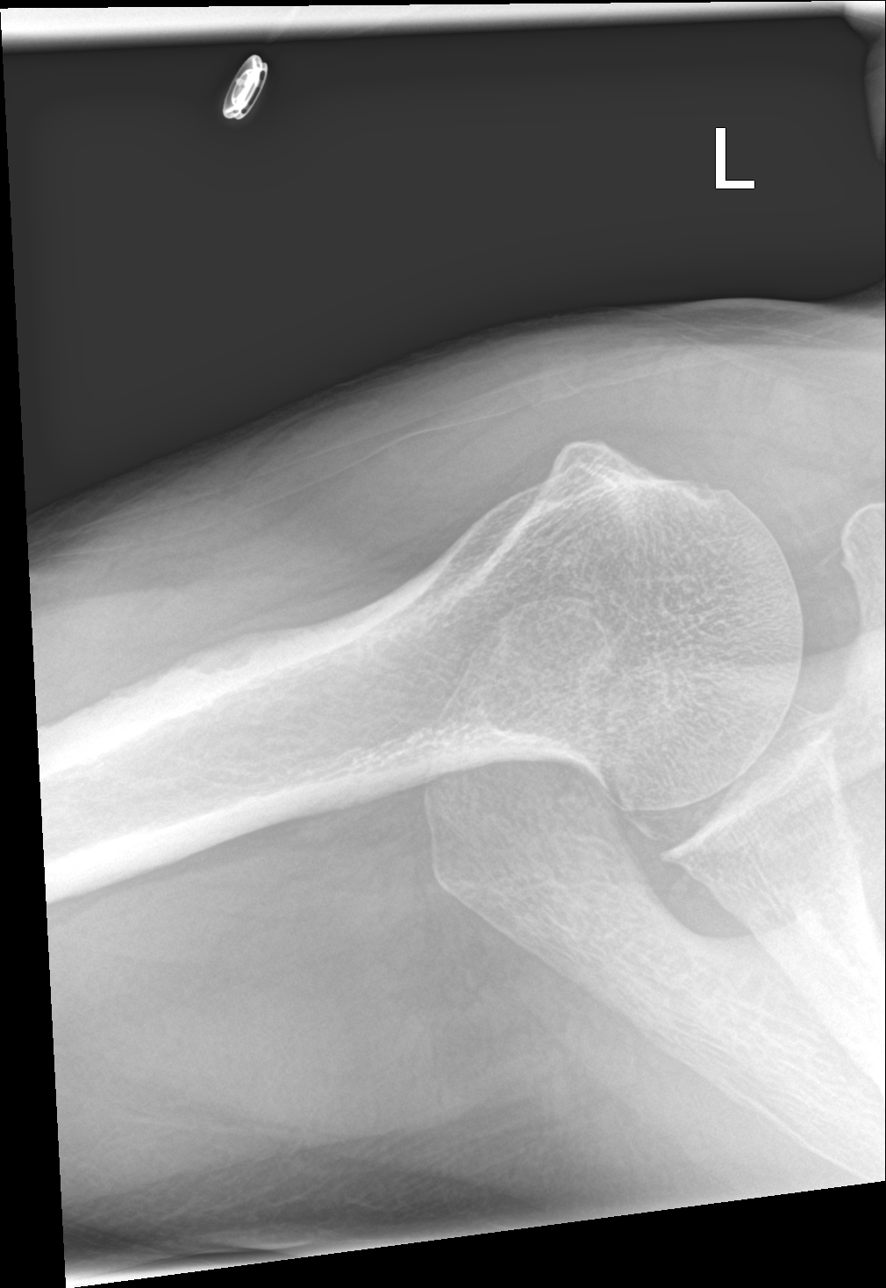

[shoulder grashey (2 of 2)]
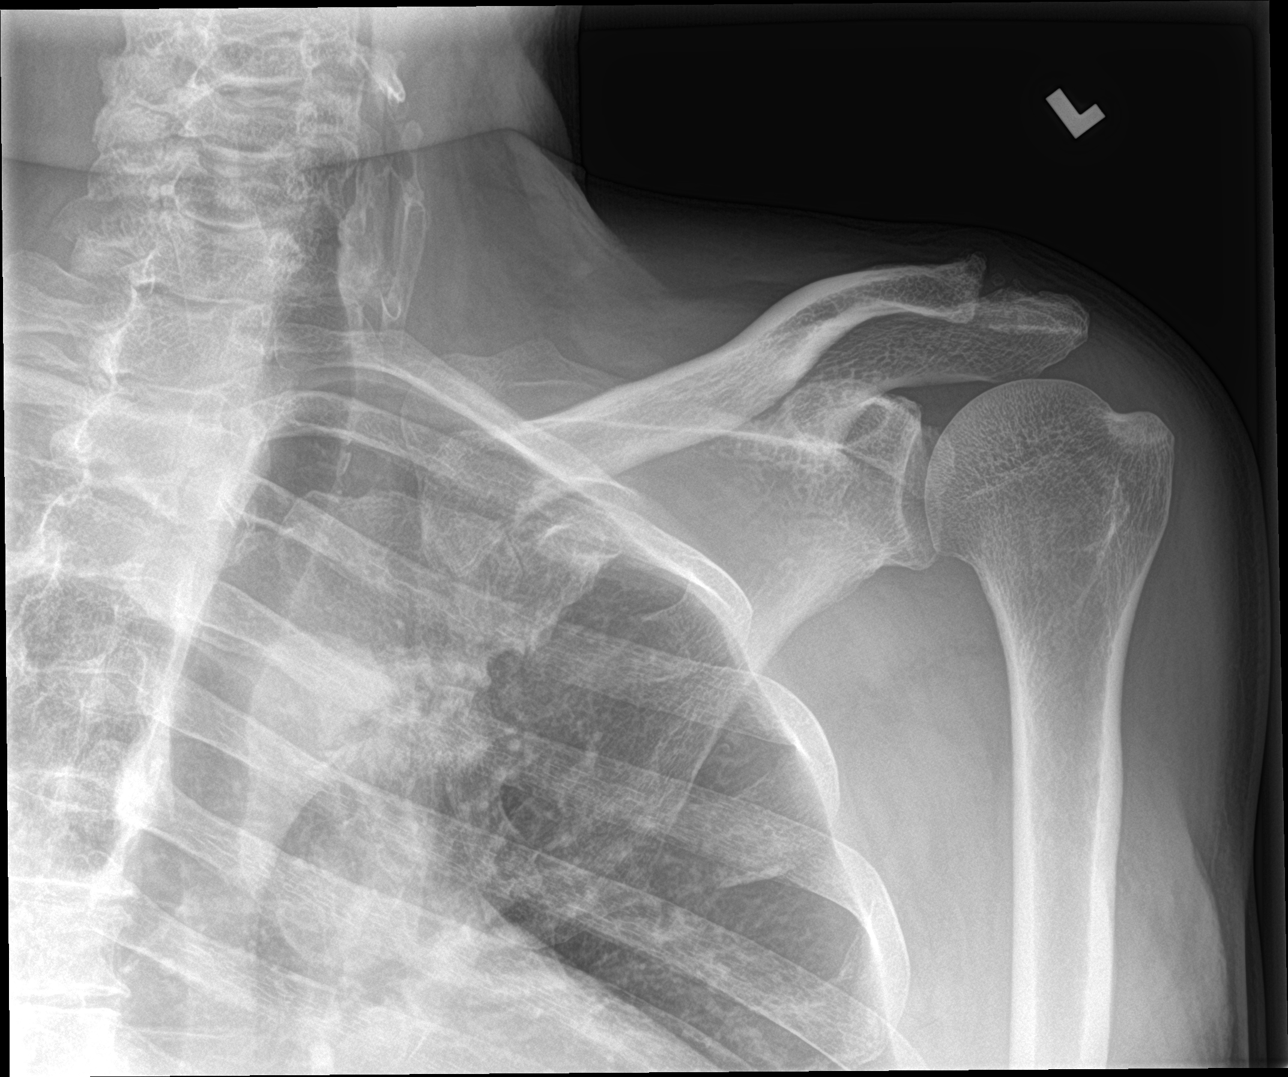

[4 of 4 positions shown; findings below may reference images not displayed]

FINDINGS: Glenohumeral joint is normal. There may be mild osteoarthritis of
the AC joint. No other regional finding of note.
IMPRESSION: Normal appearance of the glenohumeral joint. Mild osteoarthritis of
the AC joint, often seen at this age.

## 2022-06-06 ENCOUNTER — Other Ambulatory Visit: Payer: BC Managed Care – PPO | Admitting: Pharmacist

## 2022-06-07 ENCOUNTER — Encounter: Payer: Self-pay | Admitting: Family Medicine

## 2022-06-07 ENCOUNTER — Ambulatory Visit: Payer: BC Managed Care – PPO | Admitting: Family Medicine

## 2022-06-07 ENCOUNTER — Ambulatory Visit: Payer: BC Managed Care – PPO | Admitting: Internal Medicine

## 2022-06-07 VITALS — BP 140/80 | HR 80 | Ht 65.0 in | Wt 193.0 lb

## 2022-06-07 DIAGNOSIS — J449 Chronic obstructive pulmonary disease, unspecified: Secondary | ICD-10-CM

## 2022-06-07 DIAGNOSIS — M109 Gout, unspecified: Secondary | ICD-10-CM | POA: Diagnosis not present

## 2022-06-07 MED ORDER — UMECLIDINIUM-VILANTEROL 62.5-25 MCG/ACT IN AEPB: 1.0000 | INHALATION_SPRAY | Freq: Every day | RESPIRATORY_TRACT | 11 refills | Status: DC

## 2022-06-07 MED ORDER — IBUPROFEN 800 MG PO TABS: 800.0000 mg | ORAL_TABLET | Freq: Three times a day (TID) | ORAL | 0 refills | Status: DC | PRN

## 2022-06-07 MED ORDER — COLCHICINE 0.6 MG PO TABS: ORAL_TABLET | ORAL | 3 refills | Status: DC

## 2022-06-07 NOTE — Patient Instructions (Addendum)
        Great to see you today.   - Please take medications as prescribed. - Follow up with your primary health provider if any health concerns arises. - If symptoms worsen please contact your primary care provider and/or visit the emergency department.  

## 2022-06-07 NOTE — Progress Notes (Signed)
Patient Office Visit   Subjective   Patient ID: Jeffrey Krause, male    DOB: [birth year]  Age: 67 y.o. MRN: [id number]  CC:  Chief Complaint  Patient presents with   Foot Swelling    Patient complains of hand and feet swelling for 7 days. Hx of gout.    HPI Jeffrey Krause 67 year old male, presents to the clinic for gout flare up.  He  has a past medical history of Chronic pain, COPD (chronic obstructive pulmonary disease) (HCC), HTN (hypertension), Psoriasis, and Tobacco use.  Gout: Patient here for evaluation of acute gout flare up. The patient reports a few attacks involving the left and right ankle and foot. Attacks occur primarily in the left first MTP joint. Patient reports his chronic pain is worse, his joint stiffness is ongoing and his joint swelling is worse. Limitation on activities include difficulty with walking. The patient is not avoiding high purine foods and reports consuming 1-2 alcoholic drinks week.        Outpatient Encounter Medications as of 06/07/2022  Medication Sig   acetaminophen (TYLENOL) 325 MG tablet Take 2 tablets (650 mg total) by mouth every 6 (six) hours as needed for mild pain, moderate pain or fever.   albuterol (VENTOLIN HFA) 108 (90 Base) MCG/ACT inhaler Inhale 2 puffs into the lungs every 6 (six) hours as needed for wheezing or shortness of breath.   allopurinol (ZYLOPRIM) 300 MG tablet Take 1 tablet (300 mg total) by mouth daily.   betamethasone dipropionate 0.05 % cream Apply topically 2 (two) times daily.   ibuprofen (ADVIL) 800 MG tablet Take 1 tablet (800 mg total) by mouth every 8 (eight) hours as needed.   losartan (COZAAR) 25 MG tablet Take 1 tablet (25 mg total) by mouth daily.   mupirocin ointment (BACTROBAN) 2 % Apply 1 Application topically 2 (two) times daily.   naproxen (NAPROSYN) 500 MG tablet Take 1 tablet (500 mg total) by mouth 2 (two) times daily.   pantoprazole (PROTONIX) 40 MG tablet Take 1 tablet (40 mg total) by mouth  daily.   rosuvastatin (CRESTOR) 20 MG tablet Take 1 tablet (20 mg total) by mouth daily.   Vitamin D, Ergocalciferol, (DRISDOL) 1.25 MG (50000 UNIT) CAPS capsule Take 1 capsule (50,000 Units total) by mouth every 7 (seven) days.   [DISCONTINUED] Ibuprofen (ADVIL PO) Take by mouth. As needed   [DISCONTINUED] naproxen sodium (ALEVE) 220 MG tablet Take 220 mg by mouth. As needed   [DISCONTINUED] umeclidinium-vilanterol (ANORO ELLIPTA) 62.5-25 MCG/ACT AEPB Inhale 1 puff into the lungs daily.   colchicine 0.6 MG tablet One by mouth three times a day for five days for gout pain.   oxyCODONE (ROXICODONE) 5 MG immediate release tablet Take 1 tablet (5 mg total) by mouth every 4 (four) hours as needed for severe pain. (Patient not taking: Reported on 06/07/2022)   oxyCODONE-acetaminophen (PERCOCET) 5-325 MG tablet Take 1 tablet by mouth every 4 (four) hours as needed for moderate pain. (Patient not taking: Reported on 05/21/2022)   predniSONE (DELTASONE) 50 MG tablet Take 50 mg by mouth daily with breakfast. (Patient not taking: Reported on 05/21/2022)   umeclidinium-vilanterol (ANORO ELLIPTA) 62.5-25 MCG/ACT AEPB Inhale 1 puff into the lungs daily.   UNABLE TO FIND Blood pressure cuff x 1  DX I10   [DISCONTINUED] colchicine 0.6 MG tablet One by mouth three times a day for five days for gout pain. (Patient not taking: Reported on 05/21/2022)   No facility-administered  encounter medications on file as of 06/07/2022.    No past surgical history on file.  Review of Systems  Constitutional:  Negative for chills and fever.  Eyes:  Negative for blurred vision.  Respiratory:  Negative for cough.   Cardiovascular:  Negative for chest pain.  Gastrointestinal:  Negative for abdominal pain.  Musculoskeletal:  Positive for joint pain and myalgias.  Neurological:  Negative for dizziness and headaches.      Objective    BP (!) 140/80   Pulse 80   Ht 5\' 5"  (1.651 m)   Wt 193 lb (87.5 kg)   BMI 32.12 kg/m    Physical Exam Vitals reviewed.  Constitutional:      General: He is not in acute distress.    Appearance: Normal appearance. He is not ill-appearing, toxic-appearing or diaphoretic.  HENT:     Head: Normocephalic.  Eyes:     General:        Right eye: No discharge.        Left eye: No discharge.     Conjunctiva/sclera: Conjunctivae normal.  Cardiovascular:     Rate and Rhythm: Normal rate.     Pulses: Normal pulses.     Heart sounds: Normal heart sounds.  Pulmonary:     Effort: Pulmonary effort is normal. No respiratory distress.     Breath sounds: Normal breath sounds.  Musculoskeletal:        General: Swelling and tenderness present.     Cervical back: Normal range of motion.     Right ankle: Swelling present. Decreased range of motion.     Left ankle: Swelling present. Decreased range of motion.     Right foot: Decreased range of motion. Swelling present.     Left foot: Decreased range of motion. Swelling present.  Skin:    General: Skin is warm and dry.     Capillary Refill: Capillary refill takes less than 2 seconds.  Neurological:     General: No focal deficit present.     Mental Status: He is alert and oriented to person, place, and time.     Coordination: Coordination normal.     Gait: Gait normal.  Psychiatric:        Mood and Affect: Mood normal.        Behavior: Behavior normal.       Assessment & Plan:  Acute gout of multiple sites, unspecified cause Assessment & Plan: Prescribed Colchicine 0.6 mg x 5 days and Ibuprofen 800 mg PRN for pain. Advise patient to avoid acholic drinks and avoid high purine foods. Increase fluid intake, elevate and apply ice to the affected joint.  Orders: -     Colchicine; One by mouth three times a day for five days for gout pain.  Dispense: 15 tablet; Refill: 3 -     Ibuprofen; Take 1 tablet (800 mg total) by mouth every 8 (eight) hours as needed.  Dispense: 30 tablet; Refill: 0  Chronic obstructive pulmonary disease,  unspecified COPD type (HCC) -     Umeclidinium-Vilanterol; Inhale 1 puff into the lungs daily.  Dispense: 1 each; Refill: 11    Return if symptoms worsen or fail to improve.   Cruzita Lederer Newman Nip, FNP

## 2022-06-07 NOTE — Assessment & Plan Note (Signed)
Prescribed Colchicine 0.6 mg x 5 days and Ibuprofen 800 mg PRN for pain. Advise patient to avoid acholic drinks and avoid high purine foods. Increase fluid intake, elevate and apply ice to the affected joint.

## 2022-06-21 ENCOUNTER — Telehealth: Payer: Self-pay | Admitting: Family Medicine

## 2022-06-21 NOTE — Telephone Encounter (Signed)
Received [employer] form to be filled out and once completed fax back to [phone] as soon as possible. Forms put in provider box  Copied Noted Sleeved

## 2022-06-25 NOTE — Telephone Encounter (Signed)
Per patient , Malachi Bonds said needed new forms to completed  These forms were put in providers box. Patient has appt 05.22.2024. fax to [employer] asap [phone] and call patient to pick up a copy.  Copied Noted Sleeved

## 2022-06-27 ENCOUNTER — Encounter: Payer: Self-pay | Admitting: Family Medicine

## 2022-06-27 ENCOUNTER — Ambulatory Visit: Payer: BC Managed Care – PPO | Admitting: Family Medicine

## 2022-06-27 VITALS — BP 148/90 | HR 103 | Ht 65.0 in | Wt 184.0 lb

## 2022-06-27 DIAGNOSIS — L409 Psoriasis, unspecified: Secondary | ICD-10-CM | POA: Diagnosis not present

## 2022-06-27 DIAGNOSIS — M109 Gout, unspecified: Secondary | ICD-10-CM

## 2022-06-27 MED ORDER — OTEZLA 30 MG PO TABS
30.0000 mg | ORAL_TABLET | Freq: Two times a day (BID) | ORAL | 2 refills | Status: DC
Start: 2022-06-27 — End: 2022-06-27

## 2022-06-27 MED ORDER — OTEZLA 30 MG PO TABS
30.0000 mg | ORAL_TABLET | Freq: Every day | ORAL | 2 refills | Status: DC
Start: 2022-06-27 — End: 2022-07-12

## 2022-06-27 NOTE — Progress Notes (Signed)
Established Patient Office Visit  Subjective:  Patient ID: Jeffrey Krause, male    DOB: 05/10/1955  Age: 67 y.o. MRN: [id number]  CC:  Chief Complaint  Patient presents with   Foot Swelling    Pt reports foot swelling has gone down however the pain on left foot is still not better, unable to get around much.    Hand Pain    Pt reports bilateral hand pain ongoing since last visit unable to make a fist.     HPI Jeffrey Krause is a 67 y.o. male presents with complaints of bilateral hand pain and foot swelling. For the details of today's visit, please refer to the assessment and plan.    Past Medical History:  Diagnosis Date   Chronic pain    COPD (chronic obstructive pulmonary disease) (HCC)    HTN (hypertension)    Psoriasis    Tobacco use     History reviewed. No pertinent surgical history.  Family History  Problem Relation Age of Onset   Diabetes type II Father     Social History   Socioeconomic History   Marital status: Single    Spouse name: Not on file   Number of children: 15   Years of education: Not on file   Highest education level: Not on file  Occupational History   Not on file  Tobacco Use   Smoking status: Every Day    Packs/day: 0.25    Years: 42.00    Additional pack years: 0.00    Total pack years: 10.50    Types: Cigarettes   Smokeless tobacco: Not on file  Substance and Sexual Activity   Alcohol use: Yes    Alcohol/week: 2.0 standard drinks of alcohol    Types: 2 Shots of liquor per week    Comment: a day   Drug use: No   Sexual activity: Not Currently  Other Topics Concern   Not on file  Social History Narrative   Not on file   Social Determinants of Health   Financial Resource Strain: Not on file  Food Insecurity: Not on file  Transportation Needs: Not on file  Physical Activity: Not on file  Stress: Not on file  Social Connections: Not on file  Intimate Partner Violence: Not on file    Outpatient Medications Prior to  Visit  Medication Sig Dispense Refill   acetaminophen (TYLENOL) 325 MG tablet Take 2 tablets (650 mg total) by mouth every 6 (six) hours as needed for mild pain, moderate pain or fever. 12 tablet 0   albuterol (VENTOLIN HFA) 108 (90 Base) MCG/ACT inhaler Inhale 2 puffs into the lungs every 6 (six) hours as needed for wheezing or shortness of breath. 8 g 2   allopurinol (ZYLOPRIM) 300 MG tablet Take 1 tablet (300 mg total) by mouth daily. 30 tablet 5   betamethasone dipropionate 0.05 % cream Apply topically 2 (two) times daily. 30 g 0   colchicine 0.6 MG tablet One by mouth three times a day for five days for gout pain. 15 tablet 3   ibuprofen (ADVIL) 800 MG tablet Take 1 tablet (800 mg total) by mouth every 8 (eight) hours as needed. 30 tablet 0   losartan (COZAAR) 25 MG tablet Take 1 tablet (25 mg total) by mouth daily. 90 tablet 1   mupirocin ointment (BACTROBAN) 2 % Apply 1 Application topically 2 (two) times daily. 22 g 0   naproxen (NAPROSYN) 500 MG tablet Take 1 tablet (500 mg  total) by mouth 2 (two) times daily. 30 tablet 0   oxyCODONE (ROXICODONE) 5 MG immediate release tablet Take 1 tablet (5 mg total) by mouth every 4 (four) hours as needed for severe pain. 10 tablet 0   oxyCODONE-acetaminophen (PERCOCET) 5-325 MG tablet Take 1 tablet by mouth every 4 (four) hours as needed for moderate pain. 6 tablet 0   pantoprazole (PROTONIX) 40 MG tablet Take 1 tablet (40 mg total) by mouth daily. 30 tablet 2   predniSONE (DELTASONE) 50 MG tablet Take 50 mg by mouth daily with breakfast.     rosuvastatin (CRESTOR) 20 MG tablet Take 1 tablet (20 mg total) by mouth daily. 90 tablet 3   umeclidinium-vilanterol (ANORO ELLIPTA) 62.5-25 MCG/ACT AEPB Inhale 1 puff into the lungs daily. 1 each 11   UNABLE TO FIND Blood pressure cuff x 1  DX I10 1 each 0   Vitamin D, Ergocalciferol, (DRISDOL) 1.25 MG (50000 UNIT) CAPS capsule Take 1 capsule (50,000 Units total) by mouth every 7 (seven) days. 20 capsule 3    No facility-administered medications prior to visit.    No Known Allergies  ROS Review of Systems  Cardiovascular:  Positive for leg swelling.  Skin:  Positive for rash.      Objective:    Physical Exam HENT:     Head: Normocephalic.     Right Ear: External ear normal.     Left Ear: External ear normal.     Nose: No congestion or rhinorrhea.     Mouth/Throat:     Mouth: Mucous membranes are moist.  Cardiovascular:     Rate and Rhythm: Regular rhythm.     Heart sounds: No murmur heard. Pulmonary:     Effort: No respiratory distress.     Breath sounds: Normal breath sounds.  Skin:    Findings: Rash (Generalized plaque psoriasis  noted on the arms, hands, lower extremities, and feet) present.  Neurological:     Mental Status: He is alert.     BP (!) 148/90   Pulse (!) 103   Ht 5\' 5"  (1.651 m)   Wt 184 lb 0.6 oz (83.5 kg)   SpO2 98%   BMI 30.63 kg/m  Wt Readings from Last 3 Encounters:  06/27/22 184 lb 0.6 oz (83.5 kg)  06/07/22 193 lb (87.5 kg)  05/21/22 198 lb (89.8 kg)    Lab Results  Component Value Date   TSH 2.750 05/21/2022   Lab Results  Component Value Date   WBC 7.3 05/21/2022   HGB 11.9 (L) 05/21/2022   HCT 36.8 (L) 05/21/2022   MCV 80 05/21/2022   PLT 557 (H) 05/21/2022   Lab Results  Component Value Date   NA 140 05/21/2022   K 5.0 05/21/2022   CO2 18 (L) 05/21/2022   GLUCOSE 94 05/21/2022   BUN 19 05/21/2022   CREATININE 1.21 05/21/2022   BILITOT 0.3 05/21/2022   ALKPHOS 157 (H) 05/21/2022   AST 26 05/21/2022   ALT 26 05/21/2022   PROT 6.6 05/21/2022   ALBUMIN 4.0 05/21/2022   CALCIUM 9.3 05/21/2022   ANIONGAP 7 01/09/2021   EGFR 67 05/21/2022   Lab Results  Component Value Date   CHOL 235 (H) 05/21/2022   Lab Results  Component Value Date   HDL 53 05/21/2022   Lab Results  Component Value Date   LDLCALC 143 (H) 05/21/2022   Lab Results  Component Value Date   TRIG 218 (H) 05/21/2022   Lab Results  Component  Value Date   CHOLHDL 4.4 05/21/2022   Lab Results  Component Value Date   HGBA1C 5.9 (H) 05/21/2022      Assessment & Plan:  Psoriasis Assessment & Plan: Generalized plaque psoriasis noted on the arms, hands, lower extremities, and feet with notable swelling He reports that his appointment with dermatologist is in August 2024 Will provide a starter pack sample of Otezla and send a prescription for maintenance dose of Otezla 30 mg daily We will follow-up in a month  Orders: [place]; Take 1 tablet (30 mg total) by mouth daily.  Dispense: 60 tablet; Refill: 2  Gout, unspecified cause, unspecified chronicity, unspecified site -     Uric acid    Follow-up: Return in about 1 month (around 07/28/2022).   [name], FNP

## 2022-06-27 NOTE — Patient Instructions (Addendum)
I appreciate the opportunity to provide care to you today!    Follow up:  1 month  Labs: please stop by the lab today to get your blood drawn (uric acid)  Otezla titration for psoriatic arthritis Day 1: 10 mg in the morning Day 2: 10 mg in the morning and 10 mg at nighttime Day 3: 10 mg in the morning 20 mg at nighttime Day 4: 20 mg in the morning and 20 mg at bedtime Day 5: 20 mg in the morning and 30 mg at bedtime Day 6 and thereafter: 30 mg in the morning and 30 mg at bedtime Day 7: 30 mg once daily     Please continue to a heart-healthy diet and increase your physical activities. Try to exercise for at least five days a week.      It was a pleasure to see you and I look forward to continuing to work together on your health and well-being. Please do not hesitate to call the office if you need care or have questions about your care.   Have a wonderful day and week. With Gratitude, [name] MSN, FNP-BC

## 2022-06-27 NOTE — Assessment & Plan Note (Signed)
Generalized plaque psoriasis noted on the arms, hands, lower extremities, and feet with notable swelling He reports that his appointment with dermatologist is in August 2024 Will provide a starter pack sample of Otezla and send a prescription for maintenance dose of Otezla 30 mg daily We will follow-up in a month

## 2022-06-28 LAB — URIC ACID: Uric Acid: 8 mg/dL (ref 3.8–8.4)

## 2022-07-05 NOTE — Progress Notes (Signed)
06/06/2022 Name: Jeffrey Krause MRN: [id number] DOB: Apr 13, 1955  No chief complaint on file.   Jeffrey Krause is a 67 y.o. year old male who presented for a telephone visit.   They were referred to the pharmacist by their PCP for assistance in managing hypertension.    Subjective:  Care Team: Primary Care Provider: [name], FNP ; Next Scheduled Visit: 06/27/22   Medication Access/Adherence  Current Pharmacy:  [id number] - [place], [place] - [address] AT [facility] [address] [place] [address] Phone: [phone] Fax: [phone]   Patient reports affordability concerns with their medications: No  Patient reports access/transportation concerns to their pharmacy: No  Patient reports adherence concerns with their medications:  No     Hypertension:  Current medications: losartan 25mg  daily Medications previously tried: n/a  Patient does not have a validated, automated, upper arm home BP cuff Current blood pressure readings readings: 140-160s/80-90s  Patient denies hypotensive s/sx including dizziness, lightheadedness.  Patient denies hypertensive symptoms including headache, chest pain, shortness of breath  Current meal patterns: rec low salt/healthy plate method  Current physical activity: encouraged as able   Objective:  Lab Results  Component Value Date   HGBA1C 5.9 (H) 05/21/2022    Lab Results  Component Value Date   CREATININE 1.21 05/21/2022   BUN 19 05/21/2022   NA 140 05/21/2022   K 5.0 05/21/2022   CL 107 (H) 05/21/2022   CO2 18 (L) 05/21/2022    Lab Results  Component Value Date   CHOL 235 (H) 05/21/2022   HDL 53 05/21/2022   LDLCALC 143 (H) 05/21/2022   TRIG 218 (H) 05/21/2022   CHOLHDL 4.4 05/21/2022    Medications Reviewed Today     Reviewed by Herbie Saxon, CMA (Certified Medical Assistant) on 06/27/22 at 0930  Med List Status: <None>   Medication Order Taking? Sig Documenting Provider Last  Dose Status Informant  acetaminophen (TYLENOL) 325 MG tablet [id number] Yes Take 2 tablets (650 mg total) by mouth every 6 (six) hours as needed for mild pain, moderate pain or fever. Shon Hale, MD Taking Active   albuterol (VENTOLIN HFA) 108 (90 Base) MCG/ACT inhaler [id number] Yes Inhale 2 puffs into the lungs every 6 (six) hours as needed for wheezing or shortness of breath. [name], FNP Taking Active   allopurinol (ZYLOPRIM) 300 MG tablet [id number] Yes Take 1 tablet (300 mg total) by mouth daily. Darreld Mclean, MD Taking Active   betamethasone dipropionate 0.05 % cream [id number] Yes Apply topically 2 (two) times daily. [name], FNP Taking Active   colchicine 0.6 MG tablet [id number] Yes One by mouth three times a day for five days for gout pain. Del Newman Nip, Tenna Child, FNP Taking Active   ibuprofen (ADVIL) 800 MG tablet [id number] Yes Take 1 tablet (800 mg total) by mouth every 8 (eight) hours as needed. Del Newman Nip, Tenna Child, FNP Taking Active   losartan (COZAAR) 25 MG tablet [id number] Yes Take 1 tablet (25 mg total) by mouth daily. [name], FNP Taking Active   mupirocin ointment (BACTROBAN) 2 % [id number] Yes Apply 1 Application topically 2 (two) times daily. [name], FNP Taking Active   naproxen (NAPROSYN) 500 MG tablet [id number] Yes Take 1 tablet (500 mg total) by mouth 2 (two) times daily. Dione Booze, MD Taking Active   oxyCODONE (ROXICODONE) 5 MG immediate release tablet [id number] Yes Take 1 tablet (5 mg total) by mouth every 4 (four) hours as needed for severe  pain. Dione Booze, MD Taking Active   oxyCODONE-acetaminophen [facility]) 5-325 MG tablet [id number] Yes Take 1 tablet by mouth every 4 (four) hours as needed for moderate pain. Dione Booze, MD Taking Active   pantoprazole (PROTONIX) 40 MG tablet [id number] Yes Take 1 tablet (40 mg total) by mouth daily. [name], FNP Taking Active   predniSONE (DELTASONE) 50 MG tablet [id number] Yes Take  50 mg by mouth daily with breakfast. [provider] Taking Active   rosuvastatin (CRESTOR) 20 MG tablet [id number] Yes Take 1 tablet (20 mg total) by mouth daily. [name], FNP Taking Active   umeclidinium-vilanterol [facility] ELLIPTA) 62.5-25 MCG/ACT AEPB [id number] Yes Inhale 1 puff into the lungs daily. Del Newman Nip, Tenna Child, FNP Taking Active   UNABLE TO FIND [id number] Yes Blood pressure cuff x 1  DX I10 Paseda, Folashade R, FNP Taking Active   Vitamin D, Ergocalciferol, (DRISDOL) 1.25 MG (50000 UNIT) CAPS capsule [id number] Yes Take 1 capsule (50,000 Units total) by mouth every 7 (seven) days. [name], FNP Taking Active              Assessment/Plan:   Hypertension: - Currently uncontrolled - Reviewed long term cardiovascular and renal outcomes of uncontrolled blood pressure - Reviewed appropriate blood pressure monitoring technique and reviewed goal blood pressure. Recommended to check home blood pressure and heart rate ([employer] have the most affordable; also [employer] in [place] can check BP for patient free of charge) - Recommend to continue medications, there is no assistance for generic losartan (one of the cheapest BP meds so would not recommend changing); would titrate to 50mg  at follow up as needed and as able; consider CCB later if needed  Losartan filled on 05/21/22, 06/17/22 verifies patient is on track  Statin also filled -- back to rosuvastatin 10mg  nightly   Follow Up Plan: 1 month w/ PharmD  Kieth Brightly, PharmD, BCACP Clinical Pharmacist, [facility]

## 2022-07-08 DIAGNOSIS — Z0279 Encounter for issue of other medical certificate: Secondary | ICD-10-CM

## 2022-07-09 ENCOUNTER — Telehealth: Payer: Self-pay | Admitting: Family Medicine

## 2022-07-09 NOTE — Telephone Encounter (Signed)
Pt called in regard to FMLA forms.  Pt needs form submitted by 6/8  Patient wants a call back in regard.

## 2022-07-09 NOTE — Telephone Encounter (Signed)
Forms have been faxed this morning. Asked for a copy to be set at the front for pick up.

## 2022-07-10 ENCOUNTER — Other Ambulatory Visit: Payer: Self-pay | Admitting: Family Medicine

## 2022-07-10 DIAGNOSIS — K219 Gastro-esophageal reflux disease without esophagitis: Secondary | ICD-10-CM

## 2022-07-10 NOTE — Telephone Encounter (Signed)
Forms faxed with confirmation on 06/03  Form fee applied to patient account  Copy made and in scan box   Original at front desk - ready for pick up

## 2022-07-11 ENCOUNTER — Other Ambulatory Visit: Payer: BC Managed Care – PPO | Admitting: Pharmacist

## 2022-07-12 ENCOUNTER — Other Ambulatory Visit: Payer: Self-pay | Admitting: Family Medicine

## 2022-07-12 DIAGNOSIS — L409 Psoriasis, unspecified: Secondary | ICD-10-CM

## 2022-07-12 MED ORDER — OTEZLA 30 MG PO TABS
30.0000 mg | ORAL_TABLET | Freq: Every day | ORAL | 2 refills | Status: DC
Start: 2022-07-12 — End: 2022-12-24

## 2022-07-16 ENCOUNTER — Telehealth: Payer: Self-pay | Admitting: Family Medicine

## 2022-07-16 ENCOUNTER — Encounter: Payer: Self-pay | Admitting: Family Medicine

## 2022-07-16 NOTE — Telephone Encounter (Signed)
Work note to return to work on 07/17/22 ready for pick up at the front.

## 2022-07-16 NOTE — Telephone Encounter (Signed)
Spoke to pt to let him know. He will come by later today.

## 2022-07-16 NOTE — Telephone Encounter (Signed)
Pt came by office in regard to [facility]  Wants a return to work note for tomorrow. Wants a cl back when note Is ready for pick up

## 2022-07-18 ENCOUNTER — Other Ambulatory Visit: Payer: BC Managed Care – PPO | Admitting: Pharmacist

## 2022-07-18 NOTE — Progress Notes (Signed)
   07/18/2022 Name: Jeffrey Krause MRN: [id number] DOB: 23-Aug-1955  Unsuccessful outreach to patient.  Voicemail left encouraging return call to me at [phone] Reviewed fill history--compliance with losartan/rosuvastatin fills Will check in on patient's BP and HLD upon return call   Objective:  Lab Results  Component Value Date   HGBA1C 5.9 (H) 05/21/2022    Lab Results  Component Value Date   CREATININE 1.21 05/21/2022   BUN 19 05/21/2022   NA 140 05/21/2022   K 5.0 05/21/2022   CL 107 (H) 05/21/2022   CO2 18 (L) 05/21/2022    Lab Results  Component Value Date   CHOL 235 (H) 05/21/2022   HDL 53 05/21/2022   LDLCALC 143 (H) 05/21/2022   TRIG 218 (H) 05/21/2022   CHOLHDL 4.4 05/21/2022     Kieth Brightly, PharmD, BCACP Clinical Pharmacist, [facility]

## 2022-07-23 ENCOUNTER — Encounter: Payer: Self-pay | Admitting: Family Medicine

## 2022-07-23 ENCOUNTER — Ambulatory Visit: Payer: BC Managed Care – PPO | Admitting: Family Medicine

## 2022-07-23 VITALS — BP 138/88 | HR 83 | Ht 65.0 in | Wt 183.1 lb

## 2022-07-23 DIAGNOSIS — L409 Psoriasis, unspecified: Secondary | ICD-10-CM | POA: Diagnosis not present

## 2022-07-23 NOTE — Patient Instructions (Addendum)
I appreciate the opportunity to provide care to you today!    Follow up:  3 months  Otezla titration for psoriatic arthritis Day 1: 10 mg in the morning Day 2: 10 mg in the morning and 10 mg at nighttime Day 3: 10 mg in the morning 20 mg at nighttime Day 4: 20 mg in the morning and 20 mg at bedtime Day 5: 20 mg in the morning and 30 mg at bedtime Day 6 and thereafter: 30 mg in the morning and 30 mg at bedtime Day 7: 30 mg once daily    Please continue to a heart-healthy diet and increase your physical activities. Try to exercise for at least five days a week.      It was a pleasure to see you and I look forward to continuing to work together on your health and well-being. Please do not hesitate to call the office if you need care or have questions about your care.   Have a wonderful day and week. With Gratitude, [name] MSN, FNP-BC

## 2022-07-23 NOTE — Progress Notes (Signed)
Established Patient Office Visit  Subjective:  Patient ID: Jeffrey Krause, male    DOB: 01-Dec-1955  Age: 67 y.o. MRN: [id number]  CC:  Chief Complaint  Patient presents with   Chronic Care Management    2 month follow up , pt reports doing well.    Knee Pain    Pt reports knee pain and feet pain due to the gout/arthritis pain is a 9 out of 10 today.    HPI Jeffrey Krause is a 67 y.o. male with past medical history of psoriasis presents for f/u. For the details of today's visit, please refer to the assessment and plan.     Past Medical History:  Diagnosis Date   Chronic pain    COPD (chronic obstructive pulmonary disease) (HCC)    HTN (hypertension)    Psoriasis    Tobacco use     History reviewed. No pertinent surgical history.  Family History  Problem Relation Age of Onset   Diabetes type II Father     Social History   Socioeconomic History   Marital status: Single    Spouse name: Not on file   Number of children: 15   Years of education: Not on file   Highest education level: Not on file  Occupational History   Not on file  Tobacco Use   Smoking status: Every Day    Packs/day: 0.25    Years: 42.00    Additional pack years: 0.00    Total pack years: 10.50    Types: Cigarettes   Smokeless tobacco: Not on file  Substance and Sexual Activity   Alcohol use: Yes    Alcohol/week: 2.0 standard drinks of alcohol    Types: 2 Shots of liquor per week    Comment: a day   Drug use: No   Sexual activity: Not Currently  Other Topics Concern   Not on file  Social History Narrative   Not on file   Social Determinants of Health   Financial Resource Strain: Not on file  Food Insecurity: Not on file  Transportation Needs: Not on file  Physical Activity: Not on file  Stress: Not on file  Social Connections: Not on file  Intimate Partner Violence: Not on file    Outpatient Medications Prior to Visit  Medication Sig Dispense Refill   acetaminophen  (TYLENOL) 325 MG tablet Take 2 tablets (650 mg total) by mouth every 6 (six) hours as needed for mild pain, moderate pain or fever. 12 tablet 0   albuterol (VENTOLIN HFA) 108 (90 Base) MCG/ACT inhaler Inhale 2 puffs into the lungs every 6 (six) hours as needed for wheezing or shortness of breath. 8 g 2   allopurinol (ZYLOPRIM) 300 MG tablet Take 1 tablet (300 mg total) by mouth daily. 30 tablet 5   Apremilast (OTEZLA) 30 MG TABS Take 1 tablet (30 mg total) by mouth daily. 60 tablet 2   betamethasone dipropionate 0.05 % cream Apply topically 2 (two) times daily. 30 g 0   colchicine 0.6 MG tablet One by mouth three times a day for five days for gout pain. 15 tablet 3   ibuprofen (ADVIL) 800 MG tablet Take 1 tablet (800 mg total) by mouth every 8 (eight) hours as needed. 30 tablet 0   losartan (COZAAR) 25 MG tablet Take 1 tablet (25 mg total) by mouth daily. 90 tablet 1   mupirocin ointment (BACTROBAN) 2 % Apply 1 Application topically 2 (two) times daily. 22 g 0  naproxen (NAPROSYN) 500 MG tablet Take 1 tablet (500 mg total) by mouth 2 (two) times daily. 30 tablet 0   pantoprazole (PROTONIX) 40 MG tablet TAKE 1 TABLET BY MOUTH EVERY DAY 90 tablet 1   predniSONE (DELTASONE) 50 MG tablet Take 50 mg by mouth daily with breakfast.     rosuvastatin (CRESTOR) 20 MG tablet Take 1 tablet (20 mg total) by mouth daily. 90 tablet 3   umeclidinium-vilanterol (ANORO ELLIPTA) 62.5-25 MCG/ACT AEPB Inhale 1 puff into the lungs daily. 1 each 11   UNABLE TO FIND Blood pressure cuff x 1  DX I10 1 each 0   Vitamin D, Ergocalciferol, (DRISDOL) 1.25 MG (50000 UNIT) CAPS capsule Take 1 capsule (50,000 Units total) by mouth every 7 (seven) days. 20 capsule 3   oxyCODONE (ROXICODONE) 5 MG immediate release tablet Take 1 tablet (5 mg total) by mouth every 4 (four) hours as needed for severe pain. 10 tablet 0   oxyCODONE-acetaminophen (PERCOCET) 5-325 MG tablet Take 1 tablet by mouth every 4 (four) hours as needed for  moderate pain. 6 tablet 0   No facility-administered medications prior to visit.    No Known Allergies  ROS Review of Systems  Constitutional:  Negative for fatigue and fever.  Eyes:  Negative for visual disturbance.  Respiratory:  Negative for chest tightness and shortness of breath.   Cardiovascular:  Negative for chest pain and palpitations.  Skin:  Positive for rash.  Neurological:  Negative for dizziness and headaches.      Objective:    Physical Exam HENT:     Head: Normocephalic.     Right Ear: External ear normal.     Left Ear: External ear normal.     Nose: No congestion or rhinorrhea.     Mouth/Throat:     Mouth: Mucous membranes are moist.  Cardiovascular:     Rate and Rhythm: Regular rhythm.     Heart sounds: No murmur heard. Pulmonary:     Effort: No respiratory distress.     Breath sounds: Normal breath sounds.  Skin:    Findings: Rash (Generalized plaque psoriasis noted on the abdomen, back, arms, legs and hands bilaterally) present.  Neurological:     Mental Status: He is alert.     BP 138/88 (BP Location: Left Arm)   Pulse 83   Ht 5\' 5"  (1.651 m)   Wt 183 lb 1.9 oz (83.1 kg)   SpO2 99%   BMI 30.47 kg/m  Wt Readings from Last 3 Encounters:  07/23/22 183 lb 1.9 oz (83.1 kg)  06/27/22 184 lb 0.6 oz (83.5 kg)  06/07/22 193 lb (87.5 kg)    Lab Results  Component Value Date   TSH 2.750 05/21/2022   Lab Results  Component Value Date   WBC 7.3 05/21/2022   HGB 11.9 (L) 05/21/2022   HCT 36.8 (L) 05/21/2022   MCV 80 05/21/2022   PLT 557 (H) 05/21/2022   Lab Results  Component Value Date   NA 140 05/21/2022   K 5.0 05/21/2022   CO2 18 (L) 05/21/2022   GLUCOSE 94 05/21/2022   BUN 19 05/21/2022   CREATININE 1.21 05/21/2022   BILITOT 0.3 05/21/2022   ALKPHOS 157 (H) 05/21/2022   AST 26 05/21/2022   ALT 26 05/21/2022   PROT 6.6 05/21/2022   ALBUMIN 4.0 05/21/2022   CALCIUM 9.3 05/21/2022   ANIONGAP 7 01/09/2021   EGFR 67 05/21/2022    Lab Results  Component Value Date  CHOL 235 (H) 05/21/2022   Lab Results  Component Value Date   HDL 53 05/21/2022   Lab Results  Component Value Date   LDLCALC 143 (H) 05/21/2022   Lab Results  Component Value Date   TRIG 218 (H) 05/21/2022   Lab Results  Component Value Date   CHOLHDL 4.4 05/21/2022   Lab Results  Component Value Date   HGBA1C 5.9 (H) 05/21/2022      Assessment & Plan:  Psoriasis Assessment & Plan: He reports release of swelling and pain in his hands and feet with Otezla starter pack He notes that his insurance would not cover the maintenance treatment The patient has returned to work Will provide two starter pack today of [place] until he can see dermatology in August Sample provided and patient verbalized understanding     Follow-up: Return in about 3 months (around 10/23/2022).   [name], FNP

## 2022-07-23 NOTE — Assessment & Plan Note (Addendum)
He reports release of swelling and pain in his hands and feet with Otezla starter pack He notes that his insurance would not cover the maintenance treatment The patient has returned to work Will provide two starter pack today of [place] until he can see dermatology in August Sample provided and patient verbalized understanding

## 2022-07-30 ENCOUNTER — Ambulatory Visit: Payer: BC Managed Care – PPO | Admitting: Family Medicine

## 2022-08-02 ENCOUNTER — Ambulatory Visit: Payer: BC Managed Care – PPO | Admitting: Dermatology

## 2022-08-14 ENCOUNTER — Other Ambulatory Visit: Payer: Self-pay | Admitting: Family Medicine

## 2022-08-14 DIAGNOSIS — J4520 Mild intermittent asthma, uncomplicated: Secondary | ICD-10-CM

## 2022-09-03 ENCOUNTER — Telehealth: Payer: Self-pay | Admitting: Family Medicine

## 2022-09-03 NOTE — Telephone Encounter (Signed)
Attending Physicians Statement   Noted  Copied Sleeved  Original in PCP box Copy front desk folder

## 2022-09-04 ENCOUNTER — Other Ambulatory Visit: Payer: Self-pay | Admitting: Family Medicine

## 2022-09-04 NOTE — Progress Notes (Signed)
I called and left a voicemail informing the patient that I am unable to complete the physician's statement for his work absence starting on 05/16/2022 because his appointment with me was on 12/31/2021, and they resumed care on 05/21/2022. Additionally, I cannot provide office visit notes from 05/07/2022 to 05/16/2022 as requested by the forms.

## 2022-09-12 ENCOUNTER — Other Ambulatory Visit: Payer: Self-pay

## 2022-09-12 ENCOUNTER — Emergency Department (HOSPITAL_COMMUNITY): Payer: BC Managed Care – PPO

## 2022-09-12 ENCOUNTER — Telehealth (HOSPITAL_COMMUNITY): Payer: Self-pay | Admitting: Student

## 2022-09-12 ENCOUNTER — Emergency Department (HOSPITAL_COMMUNITY)
Admission: EM | Admit: 2022-09-12 | Discharge: 2022-09-12 | Disposition: A | Discharge status: Home / Self Care | Payer: BC Managed Care – PPO | Attending: Student | Admitting: Student

## 2022-09-12 ENCOUNTER — Ambulatory Visit
Admission: EM | Admit: 2022-09-12 | Discharge: 2022-09-12 | Disposition: A | Discharge status: Home / Self Care | Payer: BC Managed Care – PPO | Attending: Nurse Practitioner | Admitting: Nurse Practitioner

## 2022-09-12 ENCOUNTER — Encounter (HOSPITAL_COMMUNITY): Payer: Self-pay | Admitting: *Deleted

## 2022-09-12 DIAGNOSIS — M79605 Pain in left leg: Secondary | ICD-10-CM

## 2022-09-12 DIAGNOSIS — F17210 Nicotine dependence, cigarettes, uncomplicated: Secondary | ICD-10-CM | POA: Insufficient documentation

## 2022-09-12 DIAGNOSIS — S81802A Unspecified open wound, left lower leg, initial encounter: Secondary | ICD-10-CM

## 2022-09-12 DIAGNOSIS — M545 Low back pain, unspecified: Secondary | ICD-10-CM | POA: Diagnosis not present

## 2022-09-12 DIAGNOSIS — X58XXXA Exposure to other specified factors, initial encounter: Secondary | ICD-10-CM | POA: Diagnosis not present

## 2022-09-12 DIAGNOSIS — Z79899 Other long term (current) drug therapy: Secondary | ICD-10-CM | POA: Insufficient documentation

## 2022-09-12 DIAGNOSIS — S81801A Unspecified open wound, right lower leg, initial encounter: Secondary | ICD-10-CM | POA: Diagnosis not present

## 2022-09-12 DIAGNOSIS — J449 Chronic obstructive pulmonary disease, unspecified: Secondary | ICD-10-CM | POA: Insufficient documentation

## 2022-09-12 DIAGNOSIS — I10 Essential (primary) hypertension: Secondary | ICD-10-CM | POA: Insufficient documentation

## 2022-09-12 DIAGNOSIS — M79604 Pain in right leg: Secondary | ICD-10-CM

## 2022-09-12 DIAGNOSIS — M79661 Pain in right lower leg: Secondary | ICD-10-CM | POA: Diagnosis present

## 2022-09-12 LAB — CBC WITH DIFFERENTIAL/PLATELET
Abs Immature Granulocytes: 0.02 10*3/uL (ref 0.00–0.07)
Basophils Absolute: 0 10*3/uL (ref 0.0–0.1)
Basophils Relative: 0 %
Eosinophils Absolute: 0.2 10*3/uL (ref 0.0–0.5)
Eosinophils Relative: 3 %
HCT: 31.5 % — ABNORMAL LOW (ref 39.0–52.0)
Hemoglobin: 10.4 g/dL — ABNORMAL LOW (ref 13.0–17.0)
Immature Granulocytes: 0 %
Lymphocytes Relative: 27 %
Lymphs Abs: 1.9 10*3/uL (ref 0.7–4.0)
MCH: 26 pg (ref 26.0–34.0)
MCHC: 33 g/dL (ref 30.0–36.0)
MCV: 78.8 fL — ABNORMAL LOW (ref 80.0–100.0)
Monocytes Absolute: 0.8 10*3/uL (ref 0.1–1.0)
Monocytes Relative: 11 %
Neutro Abs: 4 10*3/uL (ref 1.7–7.7)
Neutrophils Relative %: 59 %
Platelets: 412 10*3/uL — ABNORMAL HIGH (ref 150–400)
RBC: 4 MIL/uL — ABNORMAL LOW (ref 4.22–5.81)
RDW: 17.4 % — ABNORMAL HIGH (ref 11.5–15.5)
WBC: 6.9 10*3/uL (ref 4.0–10.5)
nRBC: 0 % (ref 0.0–0.2)

## 2022-09-12 LAB — COMPREHENSIVE METABOLIC PANEL
ALT: 11 U/L (ref 0–44)
AST: 17 U/L (ref 15–41)
Albumin: 3 g/dL — ABNORMAL LOW (ref 3.5–5.0)
Alkaline Phosphatase: 121 U/L (ref 38–126)
Anion gap: 11 (ref 5–15)
BUN: 13 mg/dL (ref 8–23)
CO2: 24 mmol/L (ref 22–32)
Calcium: 8.3 mg/dL — ABNORMAL LOW (ref 8.9–10.3)
Chloride: 103 mmol/L (ref 98–111)
Creatinine, Ser: 0.93 mg/dL (ref 0.61–1.24)
GFR, Estimated: >60 mL/min (ref >60)
Glucose, Bld: 90 mg/dL (ref 70–99)
Potassium: 3.9 mmol/L (ref 3.5–5.1)
Sodium: 138 mmol/L (ref 135–145)
Total Bilirubin: 0.7 mg/dL (ref 0.3–1.2)
Total Protein: 7 g/dL (ref 6.5–8.1)

## 2022-09-12 LAB — C-REACTIVE PROTEIN: CRP: 1.2 mg/dL — ABNORMAL HIGH (ref <1.0)

## 2022-09-12 LAB — SEDIMENTATION RATE: Sed Rate: 43 mm/hr — ABNORMAL HIGH (ref 0–16)

## 2022-09-12 MED ORDER — DOXYCYCLINE HYCLATE 100 MG PO CAPS: 100.0000 mg | ORAL_CAPSULE | Freq: Two times a day (BID) | ORAL | 0 refills | Status: DC

## 2022-09-12 MED ORDER — OXYCODONE-ACETAMINOPHEN 5-325 MG PO TABS: 1.0000 | ORAL_TABLET | Freq: Four times a day (QID) | ORAL | 0 refills | Status: DC | PRN

## 2022-09-12 MED ORDER — DOXYCYCLINE HYCLATE 100 MG PO TABS
100.0000 mg | ORAL_TABLET | Freq: Once | ORAL | Status: AC
  Administered 2022-09-12: 100 mg via ORAL
  Filled 2022-09-12: qty 1

## 2022-09-12 MED ORDER — HYDROMORPHONE HCL 1 MG/ML IJ SOLN
1.0000 mg | Freq: Once | INTRAMUSCULAR | Status: AC
  Administered 2022-09-12: 1 mg via INTRAVENOUS
  Filled 2022-09-12: qty 1

## 2022-09-12 MED ORDER — SILVER SULFADIAZINE 1 % EX CREA: 1.0000 | TOPICAL_CREAM | Freq: Every day | CUTANEOUS | 0 refills | Status: DC

## 2022-09-12 MED ORDER — HYDROCORTISONE 1 % EX CREA: TOPICAL_CREAM | CUTANEOUS | 0 refills | Status: DC

## 2022-09-12 MED ORDER — MORPHINE SULFATE (PF) 4 MG/ML IV SOLN
4.0000 mg | Freq: Once | INTRAVENOUS | Status: AC
  Administered 2022-09-12: 4 mg via INTRAVENOUS
  Filled 2022-09-12: qty 1

## 2022-09-12 MED ORDER — ONDANSETRON HCL 4 MG/2ML IJ SOLN
4.0000 mg | Freq: Once | INTRAMUSCULAR | Status: AC
  Administered 2022-09-12: 4 mg via INTRAVENOUS
  Filled 2022-09-12: qty 2

## 2022-09-12 MED ORDER — DEXAMETHASONE SODIUM PHOSPHATE 10 MG/ML IJ SOLN: 10.0000 mg | INTRAMUSCULAR | Status: DC

## 2022-09-12 NOTE — ED Notes (Signed)
Patient is being discharged from the Urgent Care and sent to the Emergency Department via POV . Per NP, patient is in need of higher level of care due to bilateral leg wounds. Patient is aware and verbalizes understanding of plan of care.  Vitals:   09/12/22 0832  BP: (!) 184/81  Pulse: 96  Resp: 13  Temp: 97.9 F (36.6 C)  SpO2: 100%

## 2022-09-12 NOTE — ED Provider Notes (Signed)
[place] EMERGENCY DEPARTMENT AT [facility] Provider Note  CSN: [id number] Arrival date & time: 09/12/22 7829  Chief Complaint(s) Leg Pain  HPI Jeffrey Krause is a 67 y.o. male with PMH COPD, HTN, psoriasis who presents emergency room for evaluation of bilateral lower extremity pain and rash.  Patient was following with his primary care physician in the outpatient setting who started the patient on Otezla in June 2024.  He has been taking this medication but started to develop worsening pain of the lower extremities.  His previous psoriatic plaques have transitioned to ulcerative, while lesions that are causing significant of pain.  He went to urgent care who transferred him to the emergency department for further workup due to concern for possible superinfection.  Denies chest pain, shortness of breath, abdominal pain, nausea, vomiting or other systemic symptoms.   Past Medical History Past Medical History:  Diagnosis Date   Chronic pain    COPD (chronic obstructive pulmonary disease) (HCC)    HTN (hypertension)    Psoriasis    Tobacco use    Patient Active Problem List   Diagnosis Date Noted   Gout attack 06/07/2022   GERD (gastroesophageal reflux disease) 05/22/2022   Encounter for annual general medical examination with abnormal findings in adult 12/25/2021   COPD GOLD ?  07/04/2021   Chronic pain of right knee 07/04/2021   Primary hypertension 07/04/2021   Cough 06/02/2021   Mild Liver cirrhosis (HCC) 01/09/2021   CAP (community acquired pneumonia)/Diffuse tree-in-bud opacities in both lungs 01/07/2021   Hypokalemia 01/07/2021   Left arm swelling 01/07/2021   Leukocytosis 01/07/2021   Thrombocytosis 01/07/2021   Psoriasis 01/07/2021   Cigarette smoker 01/07/2021   Home Medication(s) Prior to Admission medications   Medication Sig Start Date End Date Taking? Authorizing Provider  doxycycline (VIBRAMYCIN) 100 MG capsule Take 1 capsule (100 mg total) by mouth  2 (two) times daily. 09/12/22  Yes , , MD  oxyCODONE-acetaminophen (PERCOCET/ROXICET) 5-325 MG tablet Take 1 tablet by mouth every 6 (six) hours as needed for severe pain. 09/12/22  Yes , , MD  acetaminophen (TYLENOL) 325 MG tablet Take 2 tablets (650 mg total) by mouth every 6 (six) hours as needed for mild pain, moderate pain or fever. 01/09/21   Shon Hale, MD  albuterol (VENTOLIN HFA) 108 (90 Base) MCG/ACT inhaler TAKE 2 PUFFS BY MOUTH EVERY 6 HOURS AS NEEDED FOR WHEEZE OR SHORTNESS OF BREATH 08/14/22   [name], FNP  allopurinol (ZYLOPRIM) 300 MG tablet Take 1 tablet (300 mg total) by mouth daily. 11/29/21   Darreld Mclean, MD  Apremilast (OTEZLA) 30 MG TABS Take 1 tablet (30 mg total) by mouth daily. 07/12/22   [name], FNP  betamethasone dipropionate 0.05 % cream Apply topically 2 (two) times daily. 05/21/22   [name], FNP  colchicine 0.6 MG tablet One by mouth three times a day for five days for gout pain. 06/07/22   Del Nigel Berthold, FNP  ibuprofen (ADVIL) 800 MG tablet Take 1 tablet (800 mg total) by mouth every 8 (eight) hours as needed. 06/07/22   Del Nigel Berthold, FNP  losartan (COZAAR) 25 MG tablet Take 1 tablet (25 mg total) by mouth daily. 05/21/22   [name], FNP  mupirocin ointment (BACTROBAN) 2 % Apply 1 Application topically 2 (two) times daily. 05/21/22   [name], FNP  naproxen (NAPROSYN) 500 MG tablet Take 1 tablet (500 mg total) by mouth 2 (two) times daily. 11/27/21  Dione Booze, MD  pantoprazole (PROTONIX) 40 MG tablet TAKE 1 TABLET BY MOUTH EVERY DAY 07/10/22   [name], FNP  predniSONE (DELTASONE) 50 MG tablet Take 50 mg by mouth daily with breakfast.    [provider]  rosuvastatin (CRESTOR) 20 MG tablet Take 1 tablet (20 mg total) by mouth daily. 05/30/22   [name], FNP  umeclidinium-vilanterol (ANORO ELLIPTA) 62.5-25 MCG/ACT AEPB Inhale 1 puff into the lungs daily. 06/07/22   Del  Newman Nip, Tenna Child, FNP  UNABLE TO FIND Blood pressure cuff x 1  DX I10 07/13/21   Paseda, Phillips Grout R, FNP  Vitamin D, Ergocalciferol, (DRISDOL) 1.25 MG (50000 UNIT) CAPS capsule Take 1 capsule (50,000 Units total) by mouth every 7 (seven) days. 05/30/22   [name], FNP                                                                                                                                    Past Surgical History History reviewed. No pertinent surgical history. Family History Family History  Problem Relation Age of Onset   Diabetes type II Father     Social History Social History   Tobacco Use   Smoking status: Every Day    Current packs/day: 0.25    Average packs/day: 0.3 packs/day for 42.0 years (10.5 ttl pk-yrs)    Types: Cigarettes  Substance Use Topics   Alcohol use: Yes    Alcohol/week: 2.0 standard drinks of alcohol    Types: 2 Shots of liquor per week    Comment: a day   Drug use: No   Allergies Patient has no known allergies.  Review of Systems Review of Systems  Skin:  Positive for rash.    Physical Exam Vital Signs  I have reviewed the triage vital signs BP (!) 150/80 (BP Location: Right Arm)   Pulse 80   Temp 98.4 F (36.9 C) (Oral)   Resp 18   Ht 5\' 5"  (1.651 m)   Wt 81.6 kg   SpO2 99%   BMI 29.95 kg/m   Physical Exam Constitutional:      General: He is not in acute distress.    Appearance: Normal appearance.  HENT:     Head: Normocephalic and atraumatic.     Nose: No congestion or rhinorrhea.  Eyes:     General:        Right eye: No discharge.        Left eye: No discharge.     Extraocular Movements: Extraocular movements intact.     Pupils: Pupils are equal, round, and reactive to light.  Cardiovascular:     Rate and Rhythm: Normal rate and regular rhythm.     Heart sounds: No murmur heard. Pulmonary:     Effort: No respiratory distress.     Breath sounds: No wheezing or rales.  Abdominal:     General: There is no  distension.  Tenderness: There is no abdominal tenderness.  Musculoskeletal:        General: Normal range of motion.     Cervical back: Normal range of motion.     Right lower leg: Edema present.     Left lower leg: Edema present.  Skin:    General: Skin is warm and dry.     Findings: Lesion and rash present.  Neurological:     General: No focal deficit present.     Mental Status: He is alert.      ED Results and Treatments Labs (all labs ordered are listed, but only abnormal results are displayed) Labs Reviewed  COMPREHENSIVE METABOLIC PANEL - Abnormal; Notable for the following components:      Result Value   Calcium 8.3 (*)    Albumin 3.0 (*)    All other components within normal limits  CBC WITH DIFFERENTIAL/PLATELET - Abnormal; Notable for the following components:   RBC 4.00 (*)    Hemoglobin 10.4 (*)    HCT 31.5 (*)    MCV 78.8 (*)    RDW 17.4 (*)    Platelets 412 (*)    All other components within normal limits  SEDIMENTATION RATE - Abnormal; Notable for the following components:   Sed Rate 43 (*)    All other components within normal limits  C-REACTIVE PROTEIN - Abnormal; Notable for the following components:   CRP 1.2 (*)    All other components within normal limits                                                                                                                          Radiology DG Tibia/Fibula Right  Result Date: 09/12/2022 CLINICAL DATA:  67 year old male with a history of r/o soft tissue gas EXAM: RIGHT TIBIA AND FIBULA - 2 VIEW COMPARISON:  None Available. FINDINGS: No acute displaced fracture. No radiopaque foreign body. No soft tissue emphysema. Degenerative changes at the knee.  Edema. IMPRESSION: Negative for acute bony abnormality. No evidence of soft tissue emphysema Electronically Signed   By: Gilmer Mor D.O.   On: 09/12/2022 14:21   DG Tibia/Fibula Left  Result Date: 09/12/2022 CLINICAL DATA:  67 year old male with a history  of r/o soft tissue gas EXAM: LEFT TIBIA AND FIBULA - 2 VIEW COMPARISON:  None Available. FINDINGS: No acute displaced fracture. No radiopaque foreign body. No subcutaneous emphysema. Degenerative changes at the knee. Degenerative changes of the hindfoot. Edema. IMPRESSION: Negative for acute bony abnormality. Negative for soft tissue emphysema Electronically Signed   By: Gilmer Mor D.O.   On: 09/12/2022 14:21    Pertinent labs & imaging results that were available during my care of the patient were reviewed by me and considered in my medical decision making (see MDM for details).  Medications Ordered in ED Medications  morphine (PF) 4 MG/ML injection 4 mg (4 mg Intravenous Given 09/12/22 1143)  ondansetron (ZOFRAN) injection 4 mg (4 mg Intravenous Given 09/12/22 1143)  HYDROmorphone (DILAUDID) injection 1 mg (1 mg Intravenous Given 09/12/22 1253)  doxycycline (VIBRA-TABS) tablet 100 mg (100 mg Oral Given 09/12/22 1511)                                                                                                                                     Procedures Procedures  (including critical care time)  Medical Decision Making / ED Course   This patient presents to the ED for concern of bilateral lower extremity wounds, this involves an extensive number of treatment options, and is a complaint that carries with it a high risk of complications and morbidity.  The differential diagnosis includes wound infection, psoriasis, stasis dermatitis, cellulitis, necrotizing fasciitis  MDM: Patient seen emergency room for evaluation of lower extremity wounds.  Physical exam with bilateral hypopigmented wounds with intermixed ulceration at the site of previous psoriasis plaques.  Laboratory evaluation with no significant leukocytosis, hemoglobin of 10.9, platelet count 412, albumin 3.0, sed rate 43 but CRP only minimally elevated at 1.2.  X-ray imaging without evidence of soft tissue gas.  Given that these  wounds seemed to have appeared after starting Cosentyx and directly overlie the site of previous plaques, I consulted the dermatologist on-call at [facility] Dr. Laural Benes who states that this is not a typical reaction to Cosentyx.  He is recommending antibiotic coverage, Silvadene and hydrocortisone mixed cream over the wounds and close outpatient dermatology follow-up if the patient is not to be admitted.  Patient's pain was controlled here in the emergency department and with overall normal lab workup and vital signs without fever or tachycardia, we will trial outpatient follow-up and defer hospital admission at this time for these wounds that appear to have been present for multiple months.  I personally called the [facility] clinic and the patient [facility] [place].  He was given return precautions of which she voiced understanding he was discharged with close outpatient dermatology follow-up.   Additional history obtained:  -External records from outside source obtained and reviewed including: Chart review including previous notes, labs, imaging, consultation notes   Lab Tests: -I ordered, reviewed, and interpreted labs.   The pertinent results include:   Labs Reviewed  COMPREHENSIVE METABOLIC PANEL - Abnormal; Notable for the following components:      Result Value   Calcium 8.3 (*)    Albumin 3.0 (*)    All other components within normal limits  CBC WITH DIFFERENTIAL/PLATELET - Abnormal; Notable for the following components:   RBC 4.00 (*)    Hemoglobin 10.4 (*)    HCT 31.5 (*)    MCV 78.8 (*)    RDW 17.4 (*)    Platelets 412 (*)    All other components within normal limits  SEDIMENTATION RATE - Abnormal; Notable for the following components:   Sed Rate 43 (*)    All other components  within normal limits  C-REACTIVE PROTEIN - Abnormal; Notable for the following components:   CRP 1.2 (*)    All other components  within normal limits      Imaging Studies ordered: I ordered imaging studies including x-ray of tib-fib bilaterally I independently visualized and interpreted imaging. I agree with the radiologist interpretation   Medicines ordered and prescription drug management: Meds ordered this encounter  Medications   morphine (PF) 4 MG/ML injection 4 mg   ondansetron (ZOFRAN) injection 4 mg   HYDROmorphone (DILAUDID) injection 1 mg   doxycycline (VIBRA-TABS) tablet 100 mg   doxycycline (VIBRAMYCIN) 100 MG capsule    Sig: Take 1 capsule (100 mg total) by mouth 2 (two) times daily.    Dispense:  20 capsule    Refill:  0   oxyCODONE-acetaminophen (PERCOCET/ROXICET) 5-325 MG tablet    Sig: Take 1 tablet by mouth every 6 (six) hours as needed for severe pain.    Dispense:  15 tablet    Refill:  0    -I have reviewed the patients home medicines and have made adjustments as needed  Critical interventions none  Consultations Obtained: I requested consultation with the [facility] dermatologist on-call Dr. Laural Benes,  and discussed lab and imaging findings as well as pertinent plan - they recommend: Silvadene, hydrocortisone and close outpatient dermatology follow-up   Cardiac Monitoring: The patient was maintained on a cardiac monitor.  I personally viewed and interpreted the cardiac monitored which showed an underlying rhythm of: NSR  Social Determinants of Health:  Factors impacting patients care include: none   Reevaluation: After the interventions noted above, I reevaluated the patient and found that they have :improved  Co morbidities that complicate the patient evaluation  Past Medical History:  Diagnosis Date   Chronic pain    COPD (chronic obstructive pulmonary disease) (HCC)    HTN (hypertension)    Psoriasis    Tobacco use       Dispostion: I considered admission for this patient, but as we do not have dermatology coverage in this hospital and patient is not displaying  evidence of bacteremia or sepsis, we will trial close outpatient follow-up with dermatology and defer hospital admission.  Patient is in agreement with this plan but was given return precautions of which he voiced understanding     Final Clinical Impression(s) / ED Diagnoses Final diagnoses:  None     @PCDICTATION @    Glendora Score, MD 09/12/22 2115

## 2022-09-12 NOTE — ED Notes (Addendum)
Site dressed and cleaned with wound cleanser prior to pt leaving for ED. Nonadherent pad and wrapped with gauze wrapping secured with medical tape. Pt tolerated well.

## 2022-09-12 NOTE — Discharge Instructions (Signed)
Go to the emergency department for further evaluation

## 2022-09-12 NOTE — ED Provider Notes (Signed)
[facility]    CSN: [id number] Arrival date & time: 09/12/22  4098      History   Chief Complaint No chief complaint on file.   HPI Jeffrey Krause is a 67 y.o. male.   The history is provided by the patient.   The patient presents for complaints of low back pain and pain to the knees.  Patient states symptoms started approximately 2 months ago, but have since progressively worsened.  Patient states the pain radiates into the lower legs.  He denies fever, chills, chest pain, abdominal pain, injury, trauma, fall, and loss of bowel or bladder function.  Patient states that he has pain and swelling in his knees.  He states that he is also "itching "all over".  He describes the pain in his back as "pins-and-needles".  He states that this feeling is shooting through his legs and he cannot stand or walk for long periods of time.  Patient states he has been taking aspirin and using Advil for pain.  Pain is worse at night.  He also has his knees wrapped as he has open wounds noted to the bilateral knees along with lower extremity edema.  Patient reports he has been using peroxide and Vaseline to wrap his lower legs. Patient does have a history of psoriatic arthritis, COPD, hypertension, and chronic pain.  Patient reports he is not followed up with his primary care physician regarding the condition of his knees.  He states the last time he saw her, his symptoms were "not that bad". Past Medical History:  Diagnosis Date   Chronic pain    COPD (chronic obstructive pulmonary disease) (HCC)    HTN (hypertension)    Psoriasis    Tobacco use     Patient Active Problem List   Diagnosis Date Noted   Gout attack 06/07/2022   GERD (gastroesophageal reflux disease) 05/22/2022   Encounter for annual general medical examination with abnormal findings in adult 12/25/2021   COPD GOLD ?  07/04/2021   Chronic pain of right knee 07/04/2021   Primary hypertension 07/04/2021   Cough 06/02/2021    Mild Liver cirrhosis (HCC) 01/09/2021   CAP (community acquired pneumonia)/Diffuse tree-in-bud opacities in both lungs 01/07/2021   Hypokalemia 01/07/2021   Left arm swelling 01/07/2021   Leukocytosis 01/07/2021   Thrombocytosis 01/07/2021   Psoriasis 01/07/2021   Cigarette smoker 01/07/2021    History reviewed. No pertinent surgical history.     Home Medications    Prior to Admission medications   Medication Sig Start Date End Date Taking? Authorizing Provider  acetaminophen (TYLENOL) 325 MG tablet Take 2 tablets (650 mg total) by mouth every 6 (six) hours as needed for mild pain, moderate pain or fever. 01/09/21   Shon Hale, MD  albuterol (VENTOLIN HFA) 108 (90 Base) MCG/ACT inhaler TAKE 2 PUFFS BY MOUTH EVERY 6 HOURS AS NEEDED FOR WHEEZE OR SHORTNESS OF BREATH 08/14/22   [name], FNP  allopurinol (ZYLOPRIM) 300 MG tablet Take 1 tablet (300 mg total) by mouth daily. 11/29/21   Darreld Mclean, MD  Apremilast (OTEZLA) 30 MG TABS Take 1 tablet (30 mg total) by mouth daily. 07/12/22   [name], FNP  betamethasone dipropionate 0.05 % cream Apply topically 2 (two) times daily. 05/21/22   [name], FNP  colchicine 0.6 MG tablet One by mouth three times a day for five days for gout pain. 06/07/22   Del Nigel Berthold, FNP  ibuprofen (ADVIL) 800 MG tablet Take 1  tablet (800 mg total) by mouth every 8 (eight) hours as needed. 06/07/22   Del Nigel Berthold, FNP  losartan (COZAAR) 25 MG tablet Take 1 tablet (25 mg total) by mouth daily. 05/21/22   [name], FNP  mupirocin ointment (BACTROBAN) 2 % Apply 1 Application topically 2 (two) times daily. 05/21/22   [name], FNP  naproxen (NAPROSYN) 500 MG tablet Take 1 tablet (500 mg total) by mouth 2 (two) times daily. 11/27/21   Dione Booze, MD  pantoprazole (PROTONIX) 40 MG tablet TAKE 1 TABLET BY MOUTH EVERY DAY 07/10/22   [name], FNP  predniSONE (DELTASONE) 50 MG tablet Take 50 mg by mouth daily  with breakfast.    [provider]  rosuvastatin (CRESTOR) 20 MG tablet Take 1 tablet (20 mg total) by mouth daily. 05/30/22   [name], FNP  umeclidinium-vilanterol (ANORO ELLIPTA) 62.5-25 MCG/ACT AEPB Inhale 1 puff into the lungs daily. 06/07/22   Del Newman Nip, Tenna Child, FNP  UNABLE TO FIND Blood pressure cuff x 1  DX I10 07/13/21   Paseda, Phillips Grout R, FNP  Vitamin D, Ergocalciferol, (DRISDOL) 1.25 MG (50000 UNIT) CAPS capsule Take 1 capsule (50,000 Units total) by mouth every 7 (seven) days. 05/30/22   [name], FNP    Family History Family History  Problem Relation Age of Onset   Diabetes type II Father     Social History Social History   Tobacco Use   Smoking status: Every Day    Current packs/day: 0.25    Average packs/day: 0.3 packs/day for 42.0 years (10.5 ttl pk-yrs)    Types: Cigarettes  Substance Use Topics   Alcohol use: Yes    Alcohol/week: 2.0 standard drinks of alcohol    Types: 2 Shots of liquor per week    Comment: a day   Drug use: No     Allergies   Patient has no known allergies.   Review of Systems Review of Systems Per HPI  Physical Exam Triage Vital Signs ED Triage Vitals  Encounter Vitals Group     BP 09/12/22 0832 (!) 184/81     Systolic BP Percentile --      Diastolic BP Percentile --      Pulse Rate 09/12/22 0832 96     Resp 09/12/22 0832 13     Temp 09/12/22 0832 97.9 F (36.6 C)     Temp Source 09/12/22 0832 Oral     SpO2 09/12/22 0832 100 %     Weight --      Height --      Head Circumference --      Peak Flow --      Pain Score 09/12/22 0833 10     Pain Loc --      Pain Education --      Exclude from Growth Chart --    No data found.  Updated Vital Signs BP (!) 184/81 (BP Location: Right Arm)   Pulse 96   Temp 97.9 F (36.6 C) (Oral)   Resp 13   SpO2 100%   Visual Acuity Right Eye Distance:   Left Eye Distance:   Bilateral Distance:    Right Eye Near:   Left Eye Near:    Bilateral Near:      Physical Exam Vitals and nursing note reviewed.  Constitutional:      General: He is not in acute distress.    Appearance: Normal appearance.  HENT:     Head: Normocephalic.  Eyes:  Extraocular Movements: Extraocular movements intact.     Conjunctiva/sclera: Conjunctivae normal.     Pupils: Pupils are equal, round, and reactive to light.  Cardiovascular:     Rate and Rhythm: Normal rate and regular rhythm.     Pulses: Normal pulses.     Heart sounds: Normal heart sounds.  Pulmonary:     Effort: Pulmonary effort is normal.     Breath sounds: Normal breath sounds.  Abdominal:     General: Bowel sounds are normal.     Palpations: Abdomen is soft.     Tenderness: There is no abdominal tenderness.  Musculoskeletal:     Cervical back: Normal range of motion.     Right lower leg: Edema present.     Left lower leg: Edema present.  Lymphadenopathy:     Cervical: No cervical adenopathy.  Skin:    General: Skin is warm.     Findings: Wound present.     Comments: Wounds noted to bilateral lower extremities.  Areas are weeping blood.  Moderate swelling noted to bilateral lower extremities.  See attached images.  Generalized body surface area with dry, cracked, skin.  Skin is scaling and flaking.  Neurological:     General: No focal deficit present.     Mental Status: He is alert and oriented to person, place, and time.  Psychiatric:        Mood and Affect: Mood normal.        Behavior: Behavior normal.           UC Treatments / Results  Labs (all labs ordered are listed, but only abnormal results are displayed) Labs Reviewed - No data to display  EKG   Radiology No results found.  Procedures Procedures (including critical care time)  Medications Ordered in UC Medications - No data to display  Initial Impression / Assessment and Plan / UC Course  I have reviewed the triage vital signs and the nursing notes.  Pertinent labs & imaging results that were  available during my care of the patient were reviewed by me and considered in my medical decision making (see chart for details).  Patient presents for complaints of low back pain and knee pain.  On exam, patient with open wounds to his bilateral lower extremities.  Areas are oozing blood.  Patient states that the lower extremities are painful.  He has been using peroxide and Vaseline and wrapping the lower extremities for the past 2 months.  Given the condition of the patient's legs, and possible infection, patient was referred to the emergency department for further evaluation.  Patient is hypertensive, but he is otherwise stable.  Lower extremities were wrapped, patient is able to travel to the emergency department via private vehicle.  Patient is in agreement with this recommendation.  Patient is ambulatory and stable at discharge.  Final Clinical Impressions(s) / UC Diagnoses   Final diagnoses:  Multiple open wounds of left lower extremity, initial encounter  Multiple open wounds of right lower extremity, initial encounter  Low back pain, unspecified back pain laterality, unspecified chronicity, unspecified whether sciatica present  Lower extremity pain, bilateral     Discharge Instructions      Go to the emergency department for further evaluation.     ED Prescriptions   None    PDMP not reviewed this encounter.   Abran Cantor, NP 09/12/22 [phone]

## 2022-09-12 NOTE — Telephone Encounter (Signed)
Note created to send creams to pharmacy

## 2022-09-12 NOTE — ED Triage Notes (Signed)
Pt c/o pain to bilateral legs and lower back for the last couple of weeks  Pt states he has weeping and open wounds to bilateral legs just below his knee

## 2022-09-12 NOTE — ED Triage Notes (Signed)
Pt c/o back pain that feels like pens and needles shooting through his legs cannot stand for long periods of time, or walk like her used to. Pt has been using aspirin and Advil for the pain, has not helped laying down at night is the hardest.

## 2022-09-18 ENCOUNTER — Other Ambulatory Visit: Payer: Self-pay | Admitting: Family Medicine

## 2022-09-18 DIAGNOSIS — L309 Dermatitis, unspecified: Secondary | ICD-10-CM

## 2022-10-09 DIAGNOSIS — F17210 Nicotine dependence, cigarettes, uncomplicated: Secondary | ICD-10-CM | POA: Diagnosis not present

## 2022-10-09 DIAGNOSIS — M1990 Unspecified osteoarthritis, unspecified site: Secondary | ICD-10-CM | POA: Diagnosis not present

## 2022-10-09 DIAGNOSIS — N529 Male erectile dysfunction, unspecified: Secondary | ICD-10-CM | POA: Diagnosis not present

## 2022-10-09 DIAGNOSIS — K219 Gastro-esophageal reflux disease without esophagitis: Secondary | ICD-10-CM | POA: Diagnosis not present

## 2022-10-09 DIAGNOSIS — H269 Unspecified cataract: Secondary | ICD-10-CM | POA: Diagnosis not present

## 2022-10-09 DIAGNOSIS — M109 Gout, unspecified: Secondary | ICD-10-CM | POA: Diagnosis not present

## 2022-10-09 DIAGNOSIS — J449 Chronic obstructive pulmonary disease, unspecified: Secondary | ICD-10-CM | POA: Diagnosis not present

## 2022-10-09 DIAGNOSIS — I2510 Atherosclerotic heart disease of native coronary artery without angina pectoris: Secondary | ICD-10-CM | POA: Diagnosis not present

## 2022-10-09 DIAGNOSIS — I10 Essential (primary) hypertension: Secondary | ICD-10-CM | POA: Diagnosis not present

## 2022-10-09 DIAGNOSIS — Z8249 Family history of ischemic heart disease and other diseases of the circulatory system: Secondary | ICD-10-CM | POA: Diagnosis not present

## 2022-10-09 DIAGNOSIS — Z008 Encounter for other general examination: Secondary | ICD-10-CM | POA: Diagnosis not present

## 2022-10-09 DIAGNOSIS — E785 Hyperlipidemia, unspecified: Secondary | ICD-10-CM | POA: Diagnosis not present

## 2022-10-09 DIAGNOSIS — Z833 Family history of diabetes mellitus: Secondary | ICD-10-CM | POA: Diagnosis not present

## 2022-10-22 ENCOUNTER — Ambulatory Visit (INDEPENDENT_AMBULATORY_CARE_PROVIDER_SITE_OTHER): Payer: Medicare Other | Admitting: Family Medicine

## 2022-10-22 ENCOUNTER — Telehealth: Payer: Self-pay | Admitting: *Deleted

## 2022-10-22 ENCOUNTER — Encounter: Payer: Self-pay | Admitting: Family Medicine

## 2022-10-22 VITALS — BP 144/98 | HR 89 | Ht 65.0 in | Wt 162.1 lb

## 2022-10-22 DIAGNOSIS — F17210 Nicotine dependence, cigarettes, uncomplicated: Secondary | ICD-10-CM

## 2022-10-22 DIAGNOSIS — R634 Abnormal weight loss: Secondary | ICD-10-CM | POA: Insufficient documentation

## 2022-10-22 DIAGNOSIS — I10 Essential (primary) hypertension: Secondary | ICD-10-CM | POA: Diagnosis not present

## 2022-10-22 DIAGNOSIS — E7849 Other hyperlipidemia: Secondary | ICD-10-CM | POA: Diagnosis not present

## 2022-10-22 DIAGNOSIS — R7301 Impaired fasting glucose: Secondary | ICD-10-CM

## 2022-10-22 DIAGNOSIS — E038 Other specified hypothyroidism: Secondary | ICD-10-CM

## 2022-10-22 DIAGNOSIS — Z114 Encounter for screening for human immunodeficiency virus [HIV]: Secondary | ICD-10-CM

## 2022-10-22 DIAGNOSIS — Z5982 Transportation insecurity: Secondary | ICD-10-CM

## 2022-10-22 DIAGNOSIS — E559 Vitamin D deficiency, unspecified: Secondary | ICD-10-CM

## 2022-10-22 DIAGNOSIS — E785 Hyperlipidemia, unspecified: Secondary | ICD-10-CM | POA: Insufficient documentation

## 2022-10-22 DIAGNOSIS — L409 Psoriasis, unspecified: Secondary | ICD-10-CM

## 2022-10-22 MED ORDER — LOSARTAN POTASSIUM 50 MG PO TABS: 50.0000 mg | ORAL_TABLET | Freq: Every day | ORAL | 1 refills | Status: DC

## 2022-10-22 MED ORDER — SILVER SULFADIAZINE 1 % EX CREA
1.0000 | TOPICAL_CREAM | Freq: Every day | CUTANEOUS | 0 refills | Status: DC
Start: 2022-10-22 — End: 2023-01-02

## 2022-10-22 MED ORDER — HYDROCORTISONE 1 % EX CREA
TOPICAL_CREAM | CUTANEOUS | 0 refills | Status: DC
Start: 2022-10-22 — End: 2022-12-24

## 2022-10-22 NOTE — Patient Instructions (Addendum)
I appreciate the opportunity to provide care to you today!    Follow up:  1 month  Labs: please stop by the lab today to get your blood drawn (CBC, CMP, TSH, Lipid profile, HgA1c, Vit D HIV)  -Urine gonorrhea and chlamydia screening has been ordered  Unintentional weight loss I recommend high-protein and high-calorie diet Recommended eating  5 to 6 small meals a day, instead of 3 large meals Encouraged to keep snacks around that are easy to eat. Try to eat often, even if you don't feel hungry Encouraged to drink high-protein nutritional supplement drinks (sample brand names: Ensure, Boost, Carnation Instant Breakfast   What foods give me extra calories or protein?  Eat these foods or mix them with other foods to increase calories and protein. ?Grains - Add butter, cream cheese, or nut butter to bread, crackers, or pancakes. Mix pasta or quinoa with meats or vegetables. Sprinkle granola on yogurts and hot cereals. Choose croissants, muffins, and biscuits. ?Fruits - Mix fresh, frozen, or dried fruit into cereal or smoothies. Drink 100 percent fruit juice. Eat fresh, frozen, or canned fruit packed in its own juice. ?Vegetables - Add cheese, butter, or sauces to vegetables. Put avocado in sandwiches. Eat bean dip, guacamole, or hummus with chips. ?Dairy - Use full-fat milk or milk products. Add cheese to sandwiches and casseroles. Add [place] yogurt, heavy cream, or whipping cream to smoothies and shakes. Put sour cream or butter in other foods. Eat ice cream, custard, pudding, and cottage cheese. ?Meats, poultry, seafood, and proteins - Add meats or eggs to salads, casseroles, and vegetables. Use gravies and sauces. Snack on nuts and nut butter. ?Other foods or drinks - Drink high-protein nutritional supplement drinks (sample brand names: Ensure, Boost, [employer]). Some tips to help you increase calories and protein in your diet: ?Eat 5 to 6 small meals a day, instead of 3 large  meals. You can also choose high-calorie snacks and drinks between meals. ?Keep snacks around that are easy to eat. Try to eat often, even if you don't feel hungry. ?Add butter, olive oil, pesto, nuts, sauces, gravy, powdered milk, protein powder, or cream to your foods. This gives them extra calories and protein. ?Drink 100 percent fruit juice, milkshakes, and smoothies instead of water. Try to drink at the end of meals so you don't fill up too soon. ?Add syrup, jams and jellies, honey, or brown sugar to other foods. Snack on protein bars.  -stop by [facility] to get an x-ray of the chest -CT of the abdomen and pelvis has been ordered to rule out GI cancer or pathology    Referrals today-  Dermatology and community care coordination to assist with access to transportation for your appointments    Please continue to a heart-healthy diet and increase your physical activities. Try to exercise for at least five days a week.    It was a pleasure to see you and I look forward to continuing to work together on your health and well-being. Please do not hesitate to call the office if you need care or have questions about your care.  In case of emergency, please visit the Emergency Department for urgent care, or contact our clinic at [phone] to schedule an appointment. We're here to help you!   Have a wonderful day and week. With Gratitude, [name] MSN, FNP-BC

## 2022-10-22 NOTE — Progress Notes (Signed)
Care Coordination   Note   10/22/2022 Name: Jeffrey Krause MRN: [id number] DOB: 01-Aug-1955  Jeffrey Krause is a 67 y.o. year old male who sees [name], FNP for primary care. I reached out to [name] by phone today to offer care coordination services.  Jeffrey Krause was given information about Care Coordination services today including:   The Care Coordination services include support from the care team which includes your Nurse Coordinator, Clinical Social Worker, or Pharmacist.  The Care Coordination team is here to help remove barriers to the health concerns and goals most important to you. Care Coordination services are voluntary, and the patient may decline or stop services at any time by request to their care team member.   Care Coordination Consent Status: Patient agreed to services and verbal consent obtained.   Follow up plan:  Telephone appointment with care coordination team member scheduled for:  10/29/22  Encounter Outcome:  Patient Scheduled  [facility] Coordination Care Guide  Direct Dial: [phone]

## 2022-10-22 NOTE — Assessment & Plan Note (Signed)
Smokes about 1/2 pack/3 day  Asked about quitting: confirms that he currently smokes cigarettes Advise to quit smoking: Educated about QUITTING to reduce the risk of cancer, cardio and cerebrovascular disease. Assess willingness: Unwilling to quit at this time, but is working on cutting back. Assist with counseling and pharmacotherapy: Counseled for 5 minutes and literature provided. Arrange for follow up: follow up in 3 months and continue to offer help.

## 2022-10-22 NOTE — Assessment & Plan Note (Signed)
Referral placed to dermatology

## 2022-10-22 NOTE — Assessment & Plan Note (Addendum)
I recommend high-protein and high-calorie diet Recommended eating  5 to 6 small meals a day, instead of 3 large meals Encouraged to keep snacks around that are easy to eat. Try to eat often, even if you don't feel hungry Encouraged to drink high-protein nutritional supplement drinks (sample brand names: Ensure, Boost, Carnation Instant Breakfast  Will obtain a chest x-ray to rule out any lung pathology and get a CT scan of the abdomen pelvis to rule out any GI pathology and malignancy

## 2022-10-22 NOTE — Assessment & Plan Note (Signed)
Encouraged a heart healthy diet with increased physical activity Also continue taking rosuvastatin 20 mg daily Pending lipid panel Lab Results  Component Value Date   CHOL 235 (H) 05/21/2022   HDL 53 05/21/2022   LDLCALC 143 (H) 05/21/2022   TRIG 218 (H) 05/21/2022   CHOLHDL 4.4 05/21/2022

## 2022-10-22 NOTE — Progress Notes (Signed)
Established Patient Office Visit  Subjective:  Patient ID: Jeffrey Krause, male    DOB: [birth year]  Age: 67 y.o. MRN: [id number]  CC:  Chief Complaint  Patient presents with   Care Management    3 month f/u, states he retired and his insurance has expired now only has medicare a and b   Weight Loss    Pt reports weight loss, unsure why states he snacks only.    HPI Jeffrey Krause is a 67 y.o. male with past medical history of hypertension, hyperlipidemia, and psoriasis presents for f/u of  chronic medical conditions.  Unintentional Weight Loss: The patient has lost 18 pounds since 09/12/2022. He reports a lack of appetite and decreased oral intake, noting that he only snacks. He denies fever, night sweats, chills, abdominal distention, nausea, vomiting, dysphagia, early satiety, or blood in the stool. The patient does admit to smoking half a pack of cigarettes, which lasts him 3 days.  Tobacco Use: The patient smokes half a pack of cigarettes every 3 days.  Psoriasis: The patient has not followed up with dermatology, and as a result, the referral was closed. He reports a lack of transportation to travel outside of [place].  Hypertension: The patient takes losartan 25 mg daily but reports medication compliance. he is asymptomatic in the clinic.  Hyperlipidemia: The patient takes rosuvastatin 20 mg daily and denies muscle aches or pain. He reports compliance with his treatment regimen.   Past Medical History:  Diagnosis Date   Chronic pain    COPD (chronic obstructive pulmonary disease) (HCC)    HTN (hypertension)    Psoriasis    Tobacco use     History reviewed. No pertinent surgical history.  Family History  Problem Relation Age of Onset   Diabetes type II Father     Social History   Socioeconomic History   Marital status: Single    Spouse name: Not on file   Number of children: 15   Years of education: Not on file   Highest education level: Not on file   Occupational History   Not on file  Tobacco Use   Smoking status: Every Day    Current packs/day: 0.25    Average packs/day: 0.3 packs/day for 42.0 years (10.5 ttl pk-yrs)    Types: Cigarettes   Smokeless tobacco: Not on file  Substance and Sexual Activity   Alcohol use: Yes    Alcohol/week: 2.0 standard drinks of alcohol    Types: 2 Shots of liquor per week    Comment: a day   Drug use: No   Sexual activity: Not Currently  Other Topics Concern   Not on file  Social History Narrative   Not on file   Social Determinants of Health   Financial Resource Strain: Not on file  Food Insecurity: Not on file  Transportation Needs: Not on file  Physical Activity: Not on file  Stress: Not on file  Social Connections: Not on file  Intimate Partner Violence: Not on file    Outpatient Medications Prior to Visit  Medication Sig Dispense Refill   albuterol (VENTOLIN HFA) 108 (90 Base) MCG/ACT inhaler TAKE 2 PUFFS BY MOUTH EVERY 6 HOURS AS NEEDED FOR WHEEZE OR SHORTNESS OF BREATH 18 each 2   pantoprazole (PROTONIX) 40 MG tablet TAKE 1 TABLET BY MOUTH EVERY DAY 90 tablet 1   umeclidinium-vilanterol (ANORO ELLIPTA) 62.5-25 MCG/ACT AEPB Inhale 1 puff into the lungs daily. 1 each 11   acetaminophen (TYLENOL) 325  MG tablet Take 2 tablets (650 mg total) by mouth every 6 (six) hours as needed for mild pain, moderate pain or fever. 12 tablet 0   losartan (COZAAR) 25 MG tablet Take 1 tablet (25 mg total) by mouth daily. 90 tablet 1   allopurinol (ZYLOPRIM) 300 MG tablet Take 1 tablet (300 mg total) by mouth daily. (Patient not taking: Reported on 10/22/2022) 30 tablet 5   Apremilast (OTEZLA) 30 MG TABS Take 1 tablet (30 mg total) by mouth daily. (Patient not taking: Reported on 10/22/2022) 60 tablet 2   betamethasone dipropionate 0.05 % cream APPLY TOPICALLY TWICE A DAY (Patient not taking: Reported on 10/22/2022) 30 g 0   colchicine 0.6 MG tablet One by mouth three times a day for five days for gout  pain. (Patient not taking: Reported on 10/22/2022) 15 tablet 3   mupirocin ointment (BACTROBAN) 2 % Apply 1 Application topically 2 (two) times daily. (Patient not taking: Reported on 10/22/2022) 22 g 0   naproxen (NAPROSYN) 500 MG tablet Take 1 tablet (500 mg total) by mouth 2 (two) times daily. (Patient not taking: Reported on 10/22/2022) 30 tablet 0   predniSONE (DELTASONE) 50 MG tablet Take 50 mg by mouth daily with breakfast. (Patient not taking: Reported on 10/22/2022)     rosuvastatin (CRESTOR) 20 MG tablet Take 1 tablet (20 mg total) by mouth daily. (Patient not taking: Reported on 10/22/2022) 90 tablet 3   UNABLE TO FIND Blood pressure cuff x 1  DX I10 (Patient not taking: Reported on 10/22/2022) 1 each 0   Vitamin D, Ergocalciferol, (DRISDOL) 1.25 MG (50000 UNIT) CAPS capsule Take 1 capsule (50,000 Units total) by mouth every 7 (seven) days. (Patient not taking: Reported on 10/22/2022) 20 capsule 3   doxycycline (VIBRAMYCIN) 100 MG capsule Take 1 capsule (100 mg total) by mouth 2 (two) times daily. (Patient not taking: Reported on 10/22/2022) 20 capsule 0   hydrocortisone cream 1 % Apply to affected area 2 times daily 15 g 0   ibuprofen (ADVIL) 800 MG tablet Take 1 tablet (800 mg total) by mouth every 8 (eight) hours as needed. 30 tablet 0   oxyCODONE-acetaminophen (PERCOCET/ROXICET) 5-325 MG tablet Take 1 tablet by mouth every 6 (six) hours as needed for severe pain. 15 tablet 0   silver sulfADIAZINE (SILVADENE) 1 % cream Apply 1 Application topically daily. (Patient not taking: Reported on 10/22/2022) 50 g 0   No facility-administered medications prior to visit.    No Known Allergies  ROS Review of Systems  Constitutional:  Negative for fatigue and fever.  Eyes:  Negative for visual disturbance.  Respiratory:  Negative for chest tightness and shortness of breath.   Cardiovascular:  Negative for chest pain and palpitations.  Neurological:  Negative for dizziness and headaches.       Objective:    Physical Exam HENT:     Head: Normocephalic.     Right Ear: External ear normal.     Left Ear: External ear normal.     Nose: No congestion or rhinorrhea.     Mouth/Throat:     Mouth: Mucous membranes are moist.  Cardiovascular:     Rate and Rhythm: Regular rhythm.     Heart sounds: No murmur heard. Pulmonary:     Effort: No respiratory distress.     Breath sounds: Normal breath sounds.  Neurological:     Mental Status: He is alert.     BP (!) 144/98 (BP Location: Left Arm)   Pulse  89   Ht 5\' 5"  (1.651 m)   Wt 162 lb 1.3 oz (73.5 kg)   SpO2 97%   BMI 26.97 kg/m  Wt Readings from Last 3 Encounters:  10/22/22 162 lb 1.3 oz (73.5 kg)  09/12/22 180 lb (81.6 kg)  07/23/22 183 lb 1.9 oz (83.1 kg)    Lab Results  Component Value Date   TSH 2.750 05/21/2022   Lab Results  Component Value Date   WBC 6.9 09/12/2022   HGB 10.4 (L) 09/12/2022   HCT 31.5 (L) 09/12/2022   MCV 78.8 (L) 09/12/2022   PLT 412 (H) 09/12/2022   Lab Results  Component Value Date   NA 138 09/12/2022   K 3.9 09/12/2022   CO2 24 09/12/2022   GLUCOSE 90 09/12/2022   BUN 13 09/12/2022   CREATININE 0.93 09/12/2022   BILITOT 0.7 09/12/2022   ALKPHOS 121 09/12/2022   AST 17 09/12/2022   ALT 11 09/12/2022   PROT 7.0 09/12/2022   ALBUMIN 3.0 (L) 09/12/2022   CALCIUM 8.3 (L) 09/12/2022   ANIONGAP 11 09/12/2022   EGFR 67 05/21/2022   Lab Results  Component Value Date   CHOL 235 (H) 05/21/2022   Lab Results  Component Value Date   HDL 53 05/21/2022   Lab Results  Component Value Date   LDLCALC 143 (H) 05/21/2022   Lab Results  Component Value Date   TRIG 218 (H) 05/21/2022   Lab Results  Component Value Date   CHOLHDL 4.4 05/21/2022   Lab Results  Component Value Date   HGBA1C 5.9 (H) 05/21/2022      Assessment & Plan:  Unintended weight loss Assessment & Plan: I recommend high-protein and high-calorie diet Recommended eating  5 to 6 small meals a day,  instead of 3 large meals Encouraged to keep snacks around that are easy to eat. Try to eat often, even if you don't feel hungry Encouraged to drink high-protein nutritional supplement drinks (sample brand names: Ensure, Boost, Carnation Instant Breakfast  Will obtain a chest x-ray to rule out any lung pathology and get a CT scan of the abdomen pelvis to rule out any GI pathology and malignancy  Orders: -     DG Chest 2 View -     CT ABDOMEN PELVIS WO CONTRAST -     Chlamydia/GC NAA, Confirmation  Primary hypertension Assessment & Plan: Uncontrolled Will DC losartan 25 mg Encouraged to start taking losartan 50 mg daily Encouraged low-sodium diet with increased physical activity We will follow-up on his BP in 1 month BP Readings from Last 3 Encounters:  10/22/22 (!) 144/98  09/12/22 (!) 150/80  09/12/22 (!) 184/81     Orders: -     Losartan Potassium; Take 1 tablet (50 mg total) by mouth daily.  Dispense: 90 tablet; Refill: 1  Cigarette smoker Assessment & Plan: Smokes about 1/2 pack/3 day  Asked about quitting: confirms that he currently smokes cigarettes Advise to quit smoking: Educated about QUITTING to reduce the risk of cancer, cardio and cerebrovascular disease. Assess willingness: Unwilling to quit at this time, but is working on cutting back. Assist with counseling and pharmacotherapy: Counseled for 5 minutes and literature provided. Arrange for follow up: follow up in 3 months and continue to offer help.    Other hyperlipidemia Assessment & Plan: Encouraged a heart healthy diet with increased physical activity Also continue taking rosuvastatin 20 mg daily Pending lipid panel Lab Results  Component Value Date   CHOL 235 (H)  05/21/2022   HDL 53 05/21/2022   LDLCALC 143 (H) 05/21/2022   TRIG 218 (H) 05/21/2022   CHOLHDL 4.4 05/21/2022     Orders: -     Lipid panel -     CMP14+EGFR -     CBC with Differential/Platelet  Psoriasis Assessment & Plan: Referral  placed to dermatology  Orders: -     Ambulatory referral to Dermatology -     Silver sulfADIAZINE; Apply 1 Application topically daily.  Dispense: 50 g; Refill: 0 -     Hydrocortisone; Apply to affected area 2 times daily  Dispense: 15 g; Refill: 0  Lack of access to transportation -     AMB Referral to Community Care Coordinaton ([facility] Patients)  IFG (impaired fasting glucose) -     Hemoglobin A1c  Vitamin D deficiency -     VITAMIN D 25 Hydroxy (Vit-D Deficiency, Fractures)  Other specified hypothyroidism -     TSH + free T4  Encounter for screening for HIV -     HIV Antibody (routine testing w rflx)  Note: This chart has been completed using Engineer, civil (consulting) software, and while attempts have been made to ensure accuracy, certain words and phrases may not be transcribed as intended.    Follow-up: Return in about 1 month (around 11/21/2022) for BP.   [name], FNP

## 2022-10-22 NOTE — Assessment & Plan Note (Signed)
Uncontrolled Will DC losartan 25 mg Encouraged to start taking losartan 50 mg daily Encouraged low-sodium diet with increased physical activity We will follow-up on his BP in 1 month BP Readings from Last 3 Encounters:  10/22/22 (!) 144/98  09/12/22 (!) 150/80  09/12/22 (!) 184/81

## 2022-10-23 LAB — CBC WITH DIFFERENTIAL/PLATELET
Basophils Absolute: 0 10*3/uL (ref 0.0–0.2)
Basos: 1 %
EOS (ABSOLUTE): 0.1 10*3/uL (ref 0.0–0.4)
Eos: 1 %
Hematocrit: 43.6 % (ref 37.5–51.0)
Hemoglobin: 13.4 g/dL (ref 13.0–17.7)
Immature Grans (Abs): 0 10*3/uL (ref 0.0–0.1)
Immature Granulocytes: 0 %
Lymphocytes Absolute: 2.4 10*3/uL (ref 0.7–3.1)
Lymphs: 38 %
MCH: 26.1 pg — ABNORMAL LOW (ref 26.6–33.0)
MCHC: 30.7 g/dL — ABNORMAL LOW (ref 31.5–35.7)
MCV: 85 fL (ref 79–97)
Monocytes Absolute: 0.4 10*3/uL (ref 0.1–0.9)
Monocytes: 6 %
Neutrophils Absolute: 3.4 10*3/uL (ref 1.4–7.0)
Neutrophils: 54 %
Platelets: 434 10*3/uL (ref 150–450)
RBC: 5.13 x10E6/uL (ref 4.14–5.80)
RDW: 17.2 % — ABNORMAL HIGH (ref 11.6–15.4)
WBC: 6.2 10*3/uL (ref 3.4–10.8)

## 2022-10-23 LAB — CMP14+EGFR
ALT: 42 IU/L (ref 0–44)
AST: 53 IU/L — ABNORMAL HIGH (ref 0–40)
Albumin: 4.3 g/dL (ref 3.9–4.9)
Alkaline Phosphatase: 143 IU/L — ABNORMAL HIGH (ref 44–121)
BUN/Creatinine Ratio: 26 — ABNORMAL HIGH (ref 10–24)
BUN: 30 mg/dL — ABNORMAL HIGH (ref 8–27)
Bilirubin Total: 0.3 mg/dL (ref 0.0–1.2)
CO2: 19 mmol/L — ABNORMAL LOW (ref 20–29)
Calcium: 9.3 mg/dL (ref 8.6–10.2)
Chloride: 103 mmol/L (ref 96–106)
Creatinine, Ser: 1.17 mg/dL (ref 0.76–1.27)
Globulin, Total: 3.6 g/dL (ref 1.5–4.5)
Glucose: 77 mg/dL (ref 70–99)
Potassium: 5.2 mmol/L (ref 3.5–5.2)
Sodium: 139 mmol/L (ref 134–144)
Total Protein: 7.9 g/dL (ref 6.0–8.5)
eGFR: 69 mL/min/{1.73_m2} (ref >59)

## 2022-10-23 LAB — LIPID PANEL
Chol/HDL Ratio: 2.5 ratio (ref 0.0–5.0)
Cholesterol, Total: 171 mg/dL (ref 100–199)
HDL: 68 mg/dL (ref >39)
LDL Chol Calc (NIH): 28 mg/dL (ref 0–99)
Triglycerides: 559 mg/dL (ref 0–149)
VLDL Cholesterol Cal: 75 mg/dL — ABNORMAL HIGH (ref 5–40)

## 2022-10-23 LAB — HEMOGLOBIN A1C
Est. average glucose Bld gHb Est-mCnc: 100 mg/dL
Hgb A1c MFr Bld: 5.1 % (ref 4.8–5.6)

## 2022-10-23 LAB — VITAMIN D 25 HYDROXY (VIT D DEFICIENCY, FRACTURES): Vit D, 25-Hydroxy: 6 ng/mL — ABNORMAL LOW (ref 30.0–100.0)

## 2022-10-23 LAB — HIV ANTIBODY (ROUTINE TESTING W REFLEX): HIV Screen 4th Generation wRfx: NONREACTIVE

## 2022-10-23 LAB — TSH+FREE T4
Free T4: 0.96 ng/dL (ref 0.82–1.77)
TSH: 1.7 u[IU]/mL (ref 0.450–4.500)

## 2022-10-24 ENCOUNTER — Other Ambulatory Visit: Payer: Self-pay | Admitting: Family Medicine

## 2022-10-24 DIAGNOSIS — E7849 Other hyperlipidemia: Secondary | ICD-10-CM

## 2022-10-24 DIAGNOSIS — E559 Vitamin D deficiency, unspecified: Secondary | ICD-10-CM

## 2022-10-24 MED ORDER — VITAMIN D (ERGOCALCIFEROL) 1.25 MG (50000 UNIT) PO CAPS
50000.0000 [IU] | ORAL_CAPSULE | ORAL | 3 refills | Status: DC
Start: 2022-10-24 — End: 2022-12-24

## 2022-10-24 NOTE — Progress Notes (Signed)
Please encourage the patient to return for lab tests to reassess his cholesterol levels, as his triglyceride levels are elevated. I recommend fasting for the lab tests and continuing to take rosuvastatin 20 mg daily. Additionally, he should decrease his intake of greasy, fatty, and starchy foods while increasing physical activity. His vitamin D levels are low, and a weekly vitamin D supplement has been sent to his pharmacy for him to start taking.

## 2022-10-29 ENCOUNTER — Ambulatory Visit: Payer: Self-pay

## 2022-10-29 NOTE — Patient Outreach (Signed)
Care Coordination   Initial Visit Note   10/29/2022 Name: Jeffrey Krause MRN: [id number] DOB: July 27, 1955  Jeffrey Krause is a 67 y.o. year old male who sees [name], FNP for primary care. I spoke with  [name] by phone today.  What matters to the patients health and wellness today?  Patient request assistance with transportation. Patient does not have a Medicare plan as he just turned 67.      Goals Addressed             This Visit's Progress    Care coordination activities       Interventions Today    Flowsheet Row Most Recent Value  Chronic Disease   Chronic disease during today's visit Hypertension (HTN), Chronic Obstructive Pulmonary Disease (COPD)  General Interventions   General Interventions Discussed/Reviewed General Interventions Discussed, General Interventions Reviewed, Communication with  [Pt. request transportation.Daughter in [place] and can transportation sometimes. [place]c [employer] and enrolled. Cost $2 per trip. In county only. Pt plans to have daughter to assist out of county transportation.]  Communication with --  [T/c Medicare to enroll in a plan. Pt selects [employer] which will be effective 10/01 for $29 monthly. Plan covers transportation, dental,vision, hearing,OTC.]              SDOH assessments and interventions completed:  Yes  SDOH Interventions Today    Flowsheet Row Most Recent Value  SDOH Interventions   Food Insecurity Interventions Intervention Not Indicated, Other (Comment)  [Food stamps $242]  Housing Interventions Intervention Not Indicated  Transportation Interventions Intervention Not Indicated, Other (Comment)  [Family sometimes assist]  Utilities Interventions Intervention Not Indicated        Care Coordination Interventions:  Yes, provided   Follow up plan: Follow up call scheduled for 11/09/22 at 10am    Encounter Outcome:  Patient Visit Completed

## 2022-10-29 NOTE — Patient Instructions (Signed)
Visit Information  Thank you for taking time to visit with me today. Please don't hesitate to contact me if I can be of assistance to you.   Following are the goals we discussed today:  Patient will use [employer] for local transportation needs. Patient will utilize [employer] and provide updated card to providers.   Our next appointment is by telephone on 11/09/22 at 10am  Please call the care guide team at [phone] if you need to cancel or reschedule your appointment.   If you are experiencing a Mental Health or Behavioral Health Crisis or need someone to talk to, please call 911  Patient verbalizes understanding of instructions and care plan provided today and agrees to view in MyChart. Active MyChart status and patient understanding of how to access instructions and care plan via MyChart confirmed with patient.     Telephone follow up appointment with care management team member scheduled for:11/09/22 at 10am.   Lysle Morales, BSW Social Worker [phone]

## 2022-11-09 ENCOUNTER — Ambulatory Visit: Payer: Self-pay

## 2022-11-09 NOTE — Patient Outreach (Signed)
Care Coordination   Follow Up Visit Note   11/09/2022 Name: MCNEAL MILLET MRN: [id number] DOB: 23-Aug-1955  SAIMON ROTHBARD is a 67 y.o. year old male who sees [name], FNP for primary care. I spoke with  [name] by phone today.  What matters to the patients health and wellness today?  Patient goal is achieved. No further assistance requested.    Goals Addressed             This Visit's Progress    Care coordination activities       Interventions Today    Flowsheet Row Most Recent Value  Chronic Disease   Chronic disease during today's visit Hypertension (HTN), Chronic Obstructive Pulmonary Disease (COPD)  General Interventions   General Interventions Discussed/Reviewed General Interventions Discussed, General Interventions Reviewed  [Pt reports he received his insurance card and will provide a copy to his medical providers. He will use [employer] transporation. Pt is grateful for the assistance. No further assistance requested.]              SDOH assessments and interventions completed:  No     Care Coordination Interventions:  Yes, provided   Follow up plan: No further intervention required.   Encounter Outcome:  Patient Visit Completed

## 2022-11-09 NOTE — Patient Instructions (Signed)
Visit Information  Thank you for taking time to visit with me today. Please don't hesitate to contact me if I can be of assistance to you.   Following are the goals we discussed today:  Patient goal achieved. Patient received insurance card.  If you are experiencing a Mental Health or Behavioral Health Crisis or need someone to talk to, please call 911  Patient verbalizes understanding of instructions and care plan provided today and agrees to view in MyChart. Active MyChart status and patient understanding of how to access instructions and care plan via MyChart confirmed with patient.     No further follow up required: Patient does not request follow up.  Lysle Morales, BSW Social Worker [phone]

## 2022-11-13 ENCOUNTER — Ambulatory Visit (HOSPITAL_COMMUNITY): Admission: RE | Admit: 2022-11-13 | Payer: Medicare Other | Source: Ambulatory Visit

## 2022-11-23 ENCOUNTER — Encounter: Payer: Self-pay | Admitting: Family Medicine

## 2022-11-24 ENCOUNTER — Ambulatory Visit (HOSPITAL_COMMUNITY): Payer: Medicare Other

## 2022-12-21 ENCOUNTER — Telehealth: Payer: Self-pay

## 2022-12-21 DIAGNOSIS — M109 Gout, unspecified: Secondary | ICD-10-CM

## 2022-12-21 DIAGNOSIS — J449 Chronic obstructive pulmonary disease, unspecified: Secondary | ICD-10-CM

## 2022-12-21 DIAGNOSIS — J4520 Mild intermittent asthma, uncomplicated: Secondary | ICD-10-CM

## 2022-12-21 DIAGNOSIS — I10 Essential (primary) hypertension: Secondary | ICD-10-CM

## 2022-12-21 DIAGNOSIS — E559 Vitamin D deficiency, unspecified: Secondary | ICD-10-CM

## 2022-12-21 DIAGNOSIS — L409 Psoriasis, unspecified: Secondary | ICD-10-CM

## 2022-12-21 DIAGNOSIS — L309 Dermatitis, unspecified: Secondary | ICD-10-CM

## 2022-12-21 DIAGNOSIS — K219 Gastro-esophageal reflux disease without esophagitis: Secondary | ICD-10-CM

## 2022-12-21 DIAGNOSIS — E785 Hyperlipidemia, unspecified: Secondary | ICD-10-CM

## 2022-12-24 MED ORDER — UNABLE TO FIND: 0 refills | Status: AC

## 2022-12-24 MED ORDER — UMECLIDINIUM-VILANTEROL 62.5-25 MCG/ACT IN AEPB
1.0000 | INHALATION_SPRAY | Freq: Every day | RESPIRATORY_TRACT | 11 refills | Status: DC
Start: 2022-12-24 — End: 2023-01-02

## 2022-12-24 MED ORDER — ALBUTEROL SULFATE HFA 108 (90 BASE) MCG/ACT IN AERS
1.0000 | INHALATION_SPRAY | RESPIRATORY_TRACT | 2 refills | Status: DC | PRN
Start: 2022-12-24 — End: 2023-01-15

## 2022-12-24 MED ORDER — NAPROXEN 500 MG PO TABS: 500.0000 mg | ORAL_TABLET | Freq: Two times a day (BID) | ORAL | 0 refills | Status: DC

## 2022-12-24 MED ORDER — BETAMETHASONE DIPROPIONATE 0.05 % EX CREA: TOPICAL_CREAM | Freq: Two times a day (BID) | CUTANEOUS | 0 refills | Status: DC

## 2022-12-24 MED ORDER — PANTOPRAZOLE SODIUM 40 MG PO TBEC
40.0000 mg | DELAYED_RELEASE_TABLET | Freq: Every day | ORAL | 1 refills | Status: DC
Start: 2022-12-24 — End: 2023-01-02

## 2022-12-24 MED ORDER — MUPIROCIN 2 % EX OINT: 1.0000 | TOPICAL_OINTMENT | Freq: Two times a day (BID) | CUTANEOUS | 0 refills | Status: DC

## 2022-12-24 MED ORDER — ALLOPURINOL 300 MG PO TABS: 300.0000 mg | ORAL_TABLET | Freq: Every day | ORAL | 5 refills | Status: DC

## 2022-12-24 MED ORDER — LOSARTAN POTASSIUM 50 MG PO TABS: 50.0000 mg | ORAL_TABLET | Freq: Every day | ORAL | 1 refills | Status: DC

## 2022-12-24 MED ORDER — OTEZLA 30 MG PO TABS: 30.0000 mg | ORAL_TABLET | Freq: Every day | ORAL | 2 refills | Status: DC

## 2022-12-24 MED ORDER — HYDROCORTISONE 1 % EX CREA: TOPICAL_CREAM | CUTANEOUS | 0 refills | Status: DC

## 2022-12-24 MED ORDER — ROSUVASTATIN CALCIUM 20 MG PO TABS
20.0000 mg | ORAL_TABLET | Freq: Every day | ORAL | 3 refills | Status: DC
Start: 2022-12-24 — End: 2023-01-02

## 2022-12-24 MED ORDER — VITAMIN D (ERGOCALCIFEROL) 1.25 MG (50000 UNIT) PO CAPS
50000.0000 [IU] | ORAL_CAPSULE | ORAL | 3 refills | Status: DC
Start: 2022-12-24 — End: 2023-01-02

## 2022-12-24 MED ORDER — COLCHICINE 0.6 MG PO TABS
ORAL_TABLET | ORAL | 3 refills | Status: DC
Start: 2022-12-24 — End: 2023-01-21

## 2022-12-24 NOTE — Telephone Encounter (Signed)
Pt asked to update pharmacy to select rx.

## 2022-12-31 NOTE — Telephone Encounter (Signed)
Spoke to select rx for clarifications

## 2022-12-31 NOTE — Telephone Encounter (Signed)
Jeffrey Krause was not approved by his insurance, so the patient only received the samples provided in the clinic. However, hydrocortisone cream was covered by his insurance.

## 2023-01-02 ENCOUNTER — Other Ambulatory Visit: Payer: Self-pay | Admitting: Family Medicine

## 2023-01-02 DIAGNOSIS — L309 Dermatitis, unspecified: Secondary | ICD-10-CM

## 2023-01-02 DIAGNOSIS — J449 Chronic obstructive pulmonary disease, unspecified: Secondary | ICD-10-CM

## 2023-01-02 DIAGNOSIS — K219 Gastro-esophageal reflux disease without esophagitis: Secondary | ICD-10-CM

## 2023-01-02 DIAGNOSIS — E785 Hyperlipidemia, unspecified: Secondary | ICD-10-CM

## 2023-01-02 DIAGNOSIS — L409 Psoriasis, unspecified: Secondary | ICD-10-CM

## 2023-01-02 DIAGNOSIS — E559 Vitamin D deficiency, unspecified: Secondary | ICD-10-CM

## 2023-01-02 MED ORDER — SILVER SULFADIAZINE 1 % EX CREA: 1.0000 | TOPICAL_CREAM | Freq: Every day | CUTANEOUS | 0 refills | Status: DC

## 2023-01-02 MED ORDER — MUPIROCIN 2 % EX OINT: 1.0000 | TOPICAL_OINTMENT | Freq: Two times a day (BID) | CUTANEOUS | 0 refills | Status: DC

## 2023-01-02 MED ORDER — ALLOPURINOL 300 MG PO TABS: 300.0000 mg | ORAL_TABLET | Freq: Every day | ORAL | 5 refills | Status: DC

## 2023-01-02 MED ORDER — BETAMETHASONE DIPROPIONATE 0.05 % EX CREA: TOPICAL_CREAM | Freq: Two times a day (BID) | CUTANEOUS | 0 refills | Status: DC

## 2023-01-02 MED ORDER — ROSUVASTATIN CALCIUM 20 MG PO TABS: 20.0000 mg | ORAL_TABLET | Freq: Every day | ORAL | 3 refills | Status: DC

## 2023-01-02 MED ORDER — PANTOPRAZOLE SODIUM 40 MG PO TBEC: 40.0000 mg | DELAYED_RELEASE_TABLET | Freq: Every day | ORAL | 1 refills | Status: DC

## 2023-01-02 MED ORDER — UMECLIDINIUM-VILANTEROL 62.5-25 MCG/ACT IN AEPB
1.0000 | INHALATION_SPRAY | Freq: Every day | RESPIRATORY_TRACT | 11 refills | Status: DC
Start: 2023-01-02 — End: 2023-01-21

## 2023-01-02 MED ORDER — VITAMIN D (ERGOCALCIFEROL) 1.25 MG (50000 UNIT) PO CAPS
50000.0000 [IU] | ORAL_CAPSULE | ORAL | 3 refills | Status: DC
Start: 2023-01-02 — End: 2023-01-21

## 2023-01-02 MED ORDER — NAPROXEN 500 MG PO TABS: 500.0000 mg | ORAL_TABLET | Freq: Two times a day (BID) | ORAL | 0 refills | Status: DC

## 2023-01-02 NOTE — Telephone Encounter (Signed)
Copied from CRM [id number]. Topic: Clinical - Medication Refill >> Jan 01, 2023  3:09 PM Dondra Prader A wrote: Most Recent Primary Care Visit:  Provider: [name]  Department: [facility]  Visit Type: OFFICE VISIT  Date: 10/22/2022  Medication: silver sulfADIAZINE (SILVADENE) 1 % cream, rosuvastatin 10mg , and Doxycycline 100mg    Has the patient contacted their pharmacy? Yes (Agent: If no, request that the patient contact the pharmacy for the refill. If patient does not wish to contact the pharmacy document the reason why and proceed with request.) (Agent: If yes, when and what did the pharmacy advise?)  Is this the correct pharmacy for this prescription? Yes - send medications to [facility] pharmacy If no, delete pharmacy and type the correct one.  This is the patient's preferred pharmacy:  [id number] - [place], [place] - [address] [facility] [address] [place] [address] Phone: [phone] Fax: [phone]  [facility] [place] [place] [facility] [address] [place] [address] Phone: [phone] Fax: [phone]  [facility] [place], [place] - [address] [place] [address] Phone: [phone] Fax: [phone]   Has the prescription been filled recently? No  Is the patient out of the medication? No  Has the patient been seen for an appointment in the last year OR does the patient have an upcoming appointment? Yes  Can we respond through MyChart? No  Agent: Please be advised that Rx refills may take up to 3 business days. We ask that you follow-up with your pharmacy.

## 2023-01-02 NOTE — Telephone Encounter (Signed)
Refills has been sent to the pharmacy. 

## 2023-01-05 ENCOUNTER — Other Ambulatory Visit: Payer: Self-pay | Admitting: Family Medicine

## 2023-01-15 ENCOUNTER — Other Ambulatory Visit: Payer: Self-pay | Admitting: Family Medicine

## 2023-01-15 DIAGNOSIS — J4520 Mild intermittent asthma, uncomplicated: Secondary | ICD-10-CM

## 2023-01-20 ENCOUNTER — Other Ambulatory Visit: Payer: Self-pay | Admitting: Family Medicine

## 2023-01-20 DIAGNOSIS — M109 Gout, unspecified: Secondary | ICD-10-CM

## 2023-01-21 ENCOUNTER — Ambulatory Visit (INDEPENDENT_AMBULATORY_CARE_PROVIDER_SITE_OTHER): Payer: 59 | Admitting: Family Medicine

## 2023-01-21 ENCOUNTER — Encounter: Payer: Self-pay | Admitting: Family Medicine

## 2023-01-21 VITALS — BP 108/76 | HR 85 | Wt 185.1 lb

## 2023-01-21 DIAGNOSIS — I10 Essential (primary) hypertension: Secondary | ICD-10-CM | POA: Diagnosis not present

## 2023-01-21 DIAGNOSIS — J449 Chronic obstructive pulmonary disease, unspecified: Secondary | ICD-10-CM | POA: Diagnosis not present

## 2023-01-21 DIAGNOSIS — N529 Male erectile dysfunction, unspecified: Secondary | ICD-10-CM | POA: Insufficient documentation

## 2023-01-21 DIAGNOSIS — L409 Psoriasis, unspecified: Secondary | ICD-10-CM | POA: Diagnosis not present

## 2023-01-21 DIAGNOSIS — R7301 Impaired fasting glucose: Secondary | ICD-10-CM | POA: Diagnosis not present

## 2023-01-21 DIAGNOSIS — E7849 Other hyperlipidemia: Secondary | ICD-10-CM

## 2023-01-21 DIAGNOSIS — E559 Vitamin D deficiency, unspecified: Secondary | ICD-10-CM

## 2023-01-21 DIAGNOSIS — M109 Gout, unspecified: Secondary | ICD-10-CM | POA: Diagnosis not present

## 2023-01-21 DIAGNOSIS — E038 Other specified hypothyroidism: Secondary | ICD-10-CM

## 2023-01-21 DIAGNOSIS — K219 Gastro-esophageal reflux disease without esophagitis: Secondary | ICD-10-CM

## 2023-01-21 DIAGNOSIS — N528 Other male erectile dysfunction: Secondary | ICD-10-CM | POA: Diagnosis not present

## 2023-01-21 DIAGNOSIS — R058 Other specified cough: Secondary | ICD-10-CM | POA: Diagnosis not present

## 2023-01-21 MED ORDER — PROMETHAZINE-DM 6.25-15 MG/5ML PO SYRP: 5.0000 mL | ORAL_SOLUTION | Freq: Four times a day (QID) | ORAL | 0 refills | Status: DC | PRN

## 2023-01-21 MED ORDER — ALLOPURINOL 300 MG PO TABS: 300.0000 mg | ORAL_TABLET | Freq: Every day | ORAL | 1 refills | Status: DC

## 2023-01-21 MED ORDER — SILDENAFIL CITRATE 100 MG PO TABS: 100.0000 mg | ORAL_TABLET | ORAL | 2 refills | Status: DC | PRN

## 2023-01-21 MED ORDER — ROSUVASTATIN CALCIUM 20 MG PO TABS: 20.0000 mg | ORAL_TABLET | Freq: Every day | ORAL | 1 refills | Status: DC

## 2023-01-21 MED ORDER — LOSARTAN POTASSIUM 50 MG PO TABS: 50.0000 mg | ORAL_TABLET | Freq: Every day | ORAL | 1 refills | Status: DC

## 2023-01-21 MED ORDER — COLCHICINE 0.6 MG PO TABS: ORAL_TABLET | ORAL | 3 refills | Status: DC

## 2023-01-21 MED ORDER — UMECLIDINIUM-VILANTEROL 62.5-25 MCG/ACT IN AEPB: 1.0000 | INHALATION_SPRAY | Freq: Every day | RESPIRATORY_TRACT | 11 refills | Status: DC

## 2023-01-21 MED ORDER — VITAMIN D (ERGOCALCIFEROL) 1.25 MG (50000 UNIT) PO CAPS: 50000.0000 [IU] | ORAL_CAPSULE | ORAL | 3 refills | Status: AC

## 2023-01-21 MED ORDER — PANTOPRAZOLE SODIUM 40 MG PO TBEC: 40.0000 mg | DELAYED_RELEASE_TABLET | Freq: Every day | ORAL | 1 refills | Status: DC

## 2023-01-21 MED ORDER — NAPROXEN 500 MG PO TABS: 500.0000 mg | ORAL_TABLET | Freq: Two times a day (BID) | ORAL | 1 refills | Status: DC

## 2023-01-21 NOTE — Assessment & Plan Note (Signed)
Encouraged a heart healthy diet with increased physical activity Encouraged to continue taking rosuvastatin 20 mg daily Lab Results  Component Value Date   CHOL 171 10/22/2022   HDL 68 10/22/2022   LDLCALC 28 10/22/2022   TRIG 559 (HH) 10/22/2022   CHOLHDL 2.5 10/22/2022

## 2023-01-21 NOTE — Assessment & Plan Note (Signed)
The patient reports an inability to sustain an erection. We will initiate therapy with sildenafil 100 mg to be taken an hour before sexual intercourse. Encouraged not to take more than one dose per day. Discussed common side effects of the medication, including headache, facial flushing, and nasal congestion. The patient verbalized understanding and is aware of the plan of care.

## 2023-01-21 NOTE — Assessment & Plan Note (Signed)
The patient complains of a cough with a runny nose since last week. No fever, chills, sore throat, facial pain or pressure, or recent sick contacts reported. We will treat today with Promethazine DM for cough and cold symptoms. Encouraged rest, Tylenol as needed for headaches and body aches, salt gargles for sore throat, and advised to follow up if his symptoms do not improve or worsen. The patient verbalized understanding and is aware of the plan of care.

## 2023-01-21 NOTE — Assessment & Plan Note (Signed)
The patient reports that his last gout attack was a week ago. He states that he has been out of his colchicine 0.6 mg. A refill has been sent to the pharmacy today. Encouraged to continue taking allopurinol 300 mg daily for prophylactic treatment. Advised a low-purine diet, avoiding red meat, organ meats, shellfish, and fish, to help prevent gout flare-ups. The patient verbalized understanding and is aware of the plan of care.

## 2023-01-21 NOTE — Patient Instructions (Addendum)
I appreciate the opportunity to provide care to you today!    Follow up:  4 months  Labs: please stop by the lab today/ during the week to get your blood drawn (CBC, CMP, TSH, Lipid profile, HgA1c, Vit D)  Cough and cold symptoms Start taking Promethazine DM 5 mL by mouth every 4 hours as needed for cough and cold symptoms. Increase fluid intake and allow for plenty of rest. Take Tylenol as needed for pain, fever, or general discomfort. Perform warm saltwater gargles 3-4 times daily to help with throat pain or discomfort. (Mix 1/2 teaspoon of salt in a glass of warm water and gargle several times daily to reduce throat inflammation and soothe irritation.) Ginger tea: Reduces throat irritation and can help with nausea. Look for sugar-free or honey-based throat lozenges to help with a sore throat. Use a humidifier at bedtime to help with cough and nasal congestion. For nasal congestion: The use of heated humidified air is a safe and effective therapy. Saline nasal sprays may also help alleviate nasal symptoms of the common cold. For cough: Treatment with honey may reduce cough frequency and severity. Follow up if your symptoms do not improve  Erectile Dysfunction: Start taking sildenafil 100 mg 1 hour before sexual intercourse. Do not take more than one dose per day. The medication can be taken with or without food, but avoid taking it with high-fat meals. Common side effects include headaches, dizziness, flushing, and nasal congestion.   Attached with your AVS, you will find valuable resources for self-education. I highly recommend dedicating some time to thoroughly examine them.   Please continue to a heart-healthy diet and increase your physical activities. Try to exercise for at least five days a week.    It was a pleasure to see you and I look forward to continuing to work together on your health and well-being. Please do not hesitate to call the office if you need care or  have questions about your care.  In case of emergency, please visit the Emergency Department for urgent care, or contact our clinic at [phone] to schedule an appointment. We're here to help you!   Have a wonderful day and week. With Gratitude, [name] MSN, FNP-BC

## 2023-01-21 NOTE — Assessment & Plan Note (Signed)
Controlled Encouraged to continue taking losartan 50 mg daily Low-sodium diet with increase physical activity encouraged Patient verbalized understanding is aware of plan of care BP Readings from Last 3 Encounters:  01/21/23 108/76  10/22/22 (!) 144/98  09/12/22 (!) 150/80

## 2023-01-21 NOTE — Assessment & Plan Note (Addendum)
 Stable on Protonix 40 mg daily GERD diet encouraged

## 2023-01-21 NOTE — Progress Notes (Signed)
Established Patient Office Visit  Subjective:  Patient ID: Jeffrey Krause, male    DOB: [birth year]  Age: 67 y.o. MRN: [id number]  CC:  Chief Complaint  Patient presents with   Care Management    3 month f/u   Cough    Pt reports sx of a cough with clear phlegm ongoing since last visit.     HPI Jeffrey Krause is a 67 y.o. male with past medical history of primary hypertension,  psoriasis, hyperlipidemia, GERD, and Gout presents for f/u of  chronic medical conditions. For the details of today's visit, please refer to the assessment and plan.     Past Medical History:  Diagnosis Date   Chronic pain    COPD (chronic obstructive pulmonary disease) (HCC)    HTN (hypertension)    Psoriasis    Tobacco use     History reviewed. No pertinent surgical history.  Family History  Problem Relation Age of Onset   Diabetes type II Father     Social History   Socioeconomic History   Marital status: Single    Spouse name: Not on file   Number of children: 15   Years of education: Not on file   Highest education level: Not on file  Occupational History   Not on file  Tobacco Use   Smoking status: Every Day    Current packs/day: 0.25    Average packs/day: 0.3 packs/day for 42.0 years (10.5 ttl pk-yrs)    Types: Cigarettes   Smokeless tobacco: Not on file  Substance and Sexual Activity   Alcohol use: Yes    Alcohol/week: 2.0 standard drinks of alcohol    Types: 2 Shots of liquor per week    Comment: a day   Drug use: No   Sexual activity: Not Currently  Other Topics Concern   Not on file  Social History Narrative   Not on file   Social Drivers of Health   Financial Resource Strain: Not on file  Food Insecurity: Food Insecurity Present (10/29/2022)   Hunger Vital Sign    Worried About Running Out of Food in the Last Year: Never true    Ran Out of Food in the Last Year: Sometimes true  Transportation Needs: Unmet Transportation Needs (10/29/2022)   PRAPARE -  Administrator, Civil Service (Medical): Yes    Lack of Transportation (Non-Medical): No  Physical Activity: Not on file  Stress: Not on file  Social Connections: Not on file  Intimate Partner Violence: Not on file    Outpatient Medications Prior to Visit  Medication Sig Dispense Refill   albuterol (VENTOLIN HFA) 108 (90 Base) MCG/ACT inhaler INHALE 1 TO 2 PUFFS INTO LUNGS EVERY 4 HOURS AS NEEDED FOR WHEEZING OR SHORTNESS OF BREATH 18 g 2   Apremilast (OTEZLA) 30 MG TABS Take 1 tablet (30 mg total) by mouth daily. 60 tablet 2   betamethasone dipropionate 0.05 % cream Apply topically 2 (two) times daily. 30 g 0   hydrocortisone cream 1 % Apply to affected area 2 times daily 15 g 0   mupirocin ointment (BACTROBAN) 2 % Apply 1 Application topically 2 (two) times daily. 22 g 0   predniSONE (DELTASONE) 50 MG tablet Take 50 mg by mouth daily with breakfast.     silver sulfADIAZINE (SILVADENE) 1 % cream Apply 1 Application topically daily. 50 g 0   UNABLE TO FIND Blood pressure cuff x 1  DX I10 1 each 0  allopurinol (ZYLOPRIM) 300 MG tablet Take 1 tablet (300 mg total) by mouth daily. 30 tablet 5   colchicine 0.6 MG tablet TAKE ONE TABLET BY MOUTH THREE TIMES DAILY FOR 5 DAYS FOR GOUT PAIN 15 tablet 3   losartan (COZAAR) 50 MG tablet Take 1 tablet (50 mg total) by mouth daily. 90 tablet 1   naproxen (NAPROSYN) 500 MG tablet TAKE ONE TABLET (500MG  TOTAL) BY MOUTH TWICE DAILY 30 tablet 0   pantoprazole (PROTONIX) 40 MG tablet Take 1 tablet (40 mg total) by mouth daily. 90 tablet 1   rosuvastatin (CRESTOR) 20 MG tablet Take 1 tablet (20 mg total) by mouth daily. 90 tablet 3   umeclidinium-vilanterol (ANORO ELLIPTA) 62.5-25 MCG/ACT AEPB Inhale 1 puff into the lungs daily. 1 each 11   Vitamin D, Ergocalciferol, (DRISDOL) 1.25 MG (50000 UNIT) CAPS capsule Take 1 capsule (50,000 Units total) by mouth every 7 (seven) days. 20 capsule 3   No facility-administered medications prior to visit.     No Known Allergies  ROS Review of Systems  Constitutional:  Negative for fatigue and fever.  HENT:  Positive for rhinorrhea.   Eyes:  Negative for visual disturbance.  Respiratory:  Positive for cough. Negative for chest tightness and shortness of breath.   Cardiovascular:  Negative for chest pain and palpitations.  Neurological:  Negative for dizziness and headaches.      Objective:    Physical Exam HENT:     Head: Normocephalic.     Right Ear: External ear normal.     Left Ear: External ear normal.     Nose: No congestion or rhinorrhea.     Mouth/Throat:     Mouth: Mucous membranes are moist. No oral lesions.     Palate: No lesions.     Pharynx: Uvula midline. No pharyngeal swelling or posterior oropharyngeal erythema.     Tonsils: No tonsillar exudate.  Cardiovascular:     Rate and Rhythm: Regular rhythm.     Heart sounds: No murmur heard. Pulmonary:     Effort: No respiratory distress.     Breath sounds: Normal breath sounds.  Neurological:     Mental Status: He is alert.     BP 108/76   Pulse 85   Wt 185 lb 1.9 oz (84 kg)   SpO2 94%   BMI 30.81 kg/m  Wt Readings from Last 3 Encounters:  01/21/23 185 lb 1.9 oz (84 kg)  10/22/22 162 lb 1.3 oz (73.5 kg)  09/12/22 180 lb (81.6 kg)    Lab Results  Component Value Date   TSH 1.700 10/22/2022   Lab Results  Component Value Date   WBC 6.2 10/22/2022   HGB 13.4 10/22/2022   HCT 43.6 10/22/2022   MCV 85 10/22/2022   PLT 434 10/22/2022   Lab Results  Component Value Date   NA 139 10/22/2022   K 5.2 10/22/2022   CO2 19 (L) 10/22/2022   GLUCOSE 77 10/22/2022   BUN 30 (H) 10/22/2022   CREATININE 1.17 10/22/2022   BILITOT 0.3 10/22/2022   ALKPHOS 143 (H) 10/22/2022   AST 53 (H) 10/22/2022   ALT 42 10/22/2022   PROT 7.9 10/22/2022   ALBUMIN 4.3 10/22/2022   CALCIUM 9.3 10/22/2022   ANIONGAP 11 09/12/2022   EGFR 69 10/22/2022   Lab Results  Component Value Date   CHOL 171 10/22/2022   Lab  Results  Component Value Date   HDL 68 10/22/2022   Lab Results  Component Value  Date   LDLCALC 28 10/22/2022   Lab Results  Component Value Date   TRIG 559 (HH) 10/22/2022   Lab Results  Component Value Date   CHOLHDL 2.5 10/22/2022   Lab Results  Component Value Date   HGBA1C 5.1 10/22/2022      Assessment & Plan:  Primary hypertension Assessment & Plan: Controlled Encouraged to continue taking losartan 50 mg daily Low-sodium diet with increase physical activity encouraged Patient verbalized understanding is aware of plan of care BP Readings from Last 3 Encounters:  01/21/23 108/76  10/22/22 (!) 144/98  09/12/22 (!) 150/80     Orders: -     Losartan Potassium; Take 1 tablet (50 mg total) by mouth daily.  Dispense: 90 tablet; Refill: 1  Other cough Assessment & Plan: The patient complains of a cough with a runny nose since last week. No fever, chills, sore throat, facial pain or pressure, or recent sick contacts reported. We will treat today with Promethazine DM for cough and cold symptoms. Encouraged rest, Tylenol as needed for headaches and body aches, salt gargles for sore throat, and advised to follow up if his symptoms do not improve or worsen. The patient verbalized understanding and is aware of the plan of care.    Orders: -     Promethazine-DM; Take 5 mLs by mouth 4 (four) times daily as needed.  Dispense: 118 mL; Refill: 0  Psoriasis Assessment & Plan: A referral was placed to dermatology; however, the patient reported that he was not feeling well at the time he was contacted to schedule. He reports that his psoriasis has been much better with the use of Vaseline. We will provide the number to the dermatology office so he can call and schedule an appointment. The patient verbalized understanding and is aware of the plan of care.    Other male erectile dysfunction Assessment & Plan: The patient reports an inability to sustain an erection. We  will initiate therapy with sildenafil 100 mg to be taken an hour before sexual intercourse. Encouraged not to take more than one dose per day. Discussed common side effects of the medication, including headache, facial flushing, and nasal congestion. The patient verbalized understanding and is aware of the plan of care.   Orders: -     Sildenafil Citrate; Take 1 tablet (100 mg total) by mouth as needed for erectile dysfunction. Take 1 hour prior to sexual activity: do not take >1 dose per day:  Dispense: 10 tablet; Refill: 2  Acute gout of multiple sites, unspecified cause Assessment & Plan: The patient reports that his last gout attack was a week ago. He states that he has been out of his colchicine 0.6 mg. A refill has been sent to the pharmacy today. Encouraged to continue taking allopurinol 300 mg daily for prophylactic treatment. Advised a low-purine diet, avoiding red meat, organ meats, shellfish, and fish, to help prevent gout flare-ups. The patient verbalized understanding and is aware of the plan of care.   Orders: -     Allopurinol; Take 1 tablet (300 mg total) by mouth daily.  Dispense: 90 tablet; Refill: 1 -     Naproxen; Take 1 tablet (500 mg total) by mouth 2 (two) times daily with a meal.  Dispense: 60 tablet; Refill: 1 -     Colchicine; TAKE ONE TABLET BY MOUTH THREE TIMES DAILY FOR 5 DAYS FOR GOUT PAIN  Dispense: 15 tablet; Refill: 3  Other hyperlipidemia Assessment & Plan: Encouraged a heart healthy diet  with increased physical activity Encouraged to continue taking rosuvastatin 20 mg daily Lab Results  Component Value Date   CHOL 171 10/22/2022   HDL 68 10/22/2022   LDLCALC 28 10/22/2022   TRIG 559 (HH) 10/22/2022   CHOLHDL 2.5 10/22/2022     Orders: -     Lipid panel -     CMP14+EGFR -     CBC with Differential/Platelet -     Rosuvastatin Calcium; Take 1 tablet (20 mg total) by mouth daily.  Dispense: 90 tablet; Refill: 1  Gastroesophageal reflux disease  without esophagitis Assessment & Plan: Stable on Protonix 40 mg daily GERD diet encouraged  Orders: -     Pantoprazole Sodium; Take 1 tablet (40 mg total) by mouth daily.  Dispense: 90 tablet; Refill: 1  IFG (impaired fasting glucose) -     Hemoglobin A1c  Vitamin D deficiency -     VITAMIN D 25 Hydroxy (Vit-D Deficiency, Fractures) -     Vitamin D (Ergocalciferol); Take 1 capsule (50,000 Units total) by mouth every 7 (seven) days.  Dispense: 20 capsule; Refill: 3  TSH (thyroid-stimulating hormone deficiency) -     TSH + free T4  Chronic obstructive pulmonary disease, unspecified COPD type (HCC) -     Umeclidinium-Vilanterol; Inhale 1 puff into the lungs daily.  Dispense: 1 each; Refill: 11  Note: This chart has been completed using Engineer, civil (consulting) software, and while attempts have been made to ensure accuracy, certain words and phrases may not be transcribed as intended.    Follow-up: Return in about 4 months (around 05/22/2023).   [name], FNP

## 2023-01-21 NOTE — Assessment & Plan Note (Signed)
A referral was placed to dermatology; however, the patient reported that he was not feeling well at the time he was contacted to schedule. He reports that his psoriasis has been much better with the use of Vaseline. We will provide the number to the dermatology office so he can call and schedule an appointment. The patient verbalized understanding and is aware of the plan of care.

## 2023-01-24 ENCOUNTER — Other Ambulatory Visit: Payer: Self-pay | Admitting: Family Medicine

## 2023-01-24 DIAGNOSIS — M109 Gout, unspecified: Secondary | ICD-10-CM

## 2023-01-24 NOTE — Telephone Encounter (Signed)
Copied from CRM [id number]. Topic: Clinical - Medication Refill >> Jan 24, 2023 10:20 AM Nada Libman H wrote: Most Recent Primary Care Visit:  Provider: [name]  Department: [facility]  Visit Type: OFFICE VISIT  Date: 01/21/2023  Medication: colchicine 0.6 MG tablet   Has the patient contacted their pharmacy?  (Agent: If no, request that the patient contact the pharmacy for the refill. If patient does not wish to contact the pharmacy document the reason why and proceed with request.) (Agent: If yes, when and what did the pharmacy advise?)  Is this the correct pharmacy for this prescription?  If no, delete pharmacy and type the correct one.  This is the patient's preferred pharmacy:    [facility] [place] [address] [place] [address] Phone: [phone] Fax: [phone]   Has the prescription been filled recently?   Is the patient out of the medication?   Has the patient been seen for an appointment in the last year OR does the patient have an upcoming appointment?   Can we respond through MyChart?   Agent: Please be advised that Rx refills may take up to 3 business days. We ask that you follow-up with your pharmacy.

## 2023-02-28 ENCOUNTER — Other Ambulatory Visit: Payer: Self-pay | Admitting: Family Medicine

## 2023-02-28 DIAGNOSIS — M109 Gout, unspecified: Secondary | ICD-10-CM

## 2023-04-24 ENCOUNTER — Other Ambulatory Visit: Payer: Self-pay | Admitting: Family Medicine

## 2023-04-24 DIAGNOSIS — L409 Psoriasis, unspecified: Secondary | ICD-10-CM

## 2023-04-29 ENCOUNTER — Telehealth: Payer: Self-pay | Admitting: Pharmacy Technician

## 2023-04-29 ENCOUNTER — Other Ambulatory Visit (HOSPITAL_COMMUNITY): Payer: Self-pay

## 2023-04-29 NOTE — Telephone Encounter (Signed)
 Pharmacy Patient Advocate Encounter   Received notification from CoverMyMeds that prior authorization for Otezla 30MG  tablets is required/requested.   Insurance verification completed.   The patient is insured through [facility] .   Per test claim: PA required; PA submitted to above mentioned insurance via CoverMyMeds Key/confirmation #/EOC [id number] Status is pending

## 2023-04-29 NOTE — Telephone Encounter (Signed)
 Pharmacy Patient Advocate Encounter  Received notification from [facility] that Prior Authorization for Otezla 30MG  tablets  has been APPROVED from 04/29/2023 to 10/30/2023. Ran test claim, Copay is $0.00. This test claim was processed through [facility]- copay amounts may vary at other pharmacies due to pharmacy/plan contracts, or as the patient moves through the different stages of their insurance plan.   PA #/Case ID/Reference #: [id number]

## 2023-05-27 ENCOUNTER — Ambulatory Visit (INDEPENDENT_AMBULATORY_CARE_PROVIDER_SITE_OTHER): Payer: 59 | Admitting: Family Medicine

## 2023-05-27 ENCOUNTER — Encounter: Payer: Self-pay | Admitting: Family Medicine

## 2023-05-27 VITALS — BP 147/78 | HR 70 | Ht 65.0 in | Wt 195.0 lb

## 2023-05-27 DIAGNOSIS — R7301 Impaired fasting glucose: Secondary | ICD-10-CM

## 2023-05-27 DIAGNOSIS — E559 Vitamin D deficiency, unspecified: Secondary | ICD-10-CM

## 2023-05-27 DIAGNOSIS — J301 Allergic rhinitis due to pollen: Secondary | ICD-10-CM

## 2023-05-27 DIAGNOSIS — J4520 Mild intermittent asthma, uncomplicated: Secondary | ICD-10-CM

## 2023-05-27 DIAGNOSIS — M25562 Pain in left knee: Secondary | ICD-10-CM | POA: Diagnosis not present

## 2023-05-27 DIAGNOSIS — R058 Other specified cough: Secondary | ICD-10-CM

## 2023-05-27 DIAGNOSIS — E038 Other specified hypothyroidism: Secondary | ICD-10-CM | POA: Diagnosis not present

## 2023-05-27 DIAGNOSIS — K219 Gastro-esophageal reflux disease without esophagitis: Secondary | ICD-10-CM | POA: Diagnosis not present

## 2023-05-27 DIAGNOSIS — L309 Dermatitis, unspecified: Secondary | ICD-10-CM | POA: Diagnosis not present

## 2023-05-27 DIAGNOSIS — M109 Gout, unspecified: Secondary | ICD-10-CM

## 2023-05-27 DIAGNOSIS — L409 Psoriasis, unspecified: Secondary | ICD-10-CM

## 2023-05-27 DIAGNOSIS — I10 Essential (primary) hypertension: Secondary | ICD-10-CM

## 2023-05-27 DIAGNOSIS — E7849 Other hyperlipidemia: Secondary | ICD-10-CM

## 2023-05-27 DIAGNOSIS — N528 Other male erectile dysfunction: Secondary | ICD-10-CM

## 2023-05-27 MED ORDER — ALBUTEROL SULFATE HFA 108 (90 BASE) MCG/ACT IN AERS: 1.0000 | INHALATION_SPRAY | RESPIRATORY_TRACT | 2 refills | Status: AC | PRN

## 2023-05-27 MED ORDER — LEVOCETIRIZINE DIHYDROCHLORIDE 5 MG PO TABS: 5.0000 mg | ORAL_TABLET | Freq: Every evening | ORAL | 1 refills | Status: DC

## 2023-05-27 MED ORDER — SILDENAFIL CITRATE 100 MG PO TABS: 100.0000 mg | ORAL_TABLET | ORAL | 2 refills | Status: DC | PRN

## 2023-05-27 MED ORDER — MUPIROCIN 2 % EX OINT: 1.0000 | TOPICAL_OINTMENT | Freq: Two times a day (BID) | CUTANEOUS | 0 refills | Status: AC

## 2023-05-27 MED ORDER — HYDROCORTISONE 1 % EX CREA: TOPICAL_CREAM | CUTANEOUS | 0 refills | Status: AC

## 2023-05-27 MED ORDER — LOSARTAN POTASSIUM 50 MG PO TABS: 50.0000 mg | ORAL_TABLET | Freq: Every day | ORAL | 1 refills | Status: DC

## 2023-05-27 MED ORDER — COLCHICINE 0.6 MG PO TABS: ORAL_TABLET | ORAL | 3 refills | Status: AC

## 2023-05-27 MED ORDER — PANTOPRAZOLE SODIUM 40 MG PO TBEC: 40.0000 mg | DELAYED_RELEASE_TABLET | Freq: Every day | ORAL | 1 refills | Status: DC

## 2023-05-27 MED ORDER — PROMETHAZINE-DM 6.25-15 MG/5ML PO SYRP: 5.0000 mL | ORAL_SOLUTION | Freq: Four times a day (QID) | ORAL | 0 refills | Status: DC | PRN

## 2023-05-27 MED ORDER — BETAMETHASONE DIPROPIONATE 0.05 % EX CREA: TOPICAL_CREAM | Freq: Two times a day (BID) | CUTANEOUS | 0 refills | Status: AC

## 2023-05-27 MED ORDER — SILVER SULFADIAZINE 1 % EX CREA
1.0000 | TOPICAL_CREAM | Freq: Every day | CUTANEOUS | 0 refills | Status: DC
Start: 2023-05-27 — End: 2023-10-06

## 2023-05-27 MED ORDER — ROSUVASTATIN CALCIUM 20 MG PO TABS
20.0000 mg | ORAL_TABLET | Freq: Every day | ORAL | 1 refills | Status: DC
Start: 2023-05-27 — End: 2023-11-18

## 2023-05-27 NOTE — Progress Notes (Unsigned)
 Established Patient Office Visit  Subjective:  Patient ID: Jeffrey Krause, male    DOB: [birth year]  Age: 68 y.o. MRN: [id number]  CC:  Chief Complaint  Patient presents with   Medical Management of Chronic Issues    4 month f/u  Knee pain at night Sinus drainage w/ cough x 3 months     HPI Jeffrey Krause is a 68 y.o. male with past medical history of primary hypertension, and hyperlipidemia presents for f/u of  chronic medical conditions.  Acute Pain of the Left Knee: The patient complains of intermittent pain in the left knee with no recent injury or trauma. He reports that the pain is worse at night and describes it as throbbing. The pain has been ongoing for two weeks. He denies redness or fever but does report occasional swelling of the left knee.  Sinus Drainage:The patient also reports sinus drainage for the past three days. He denies associated symptoms such as sinus pain, pressure, sore throat, or cough.  Past Medical History:  Diagnosis Date   Chronic pain    COPD (chronic obstructive pulmonary disease) (HCC)    HTN (hypertension)    Psoriasis    Tobacco use     History reviewed. No pertinent surgical history.  Family History  Problem Relation Age of Onset   Diabetes type II Father     Social History   Socioeconomic History   Marital status: Single    Spouse name: Not on file   Number of children: 15   Years of education: Not on file   Highest education level: Not on file  Occupational History   Not on file  Tobacco Use   Smoking status: Every Day    Current packs/day: 0.25    Average packs/day: 0.3 packs/day for 42.0 years (10.5 ttl pk-yrs)    Types: Cigarettes   Smokeless tobacco: Not on file  Vaping Use   Vaping status: Never Used  Substance and Sexual Activity   Alcohol use: Yes    Alcohol/week: 2.0 standard drinks of alcohol    Types: 2 Shots of liquor per week    Comment: a day   Drug use: No   Sexual activity: Yes    Birth  control/protection: None  Other Topics Concern   Not on file  Social History Narrative   Not on file   Social Drivers of Health   Financial Resource Strain: Low Risk  (05/28/2023)   Overall Financial Resource Strain (CARDIA)    Difficulty of Paying Living Expenses: Not hard at all  Food Insecurity: No Food Insecurity (05/28/2023)   Hunger Vital Sign    Worried About Running Out of Food in the Last Year: Never true    Ran Out of Food in the Last Year: Never true  Transportation Needs: No Transportation Needs (05/28/2023)   PRAPARE - Administrator, Civil Service (Medical): No    Lack of Transportation (Non-Medical): No  Physical Activity: Inactive (05/28/2023)   Exercise Vital Sign    Days of Exercise per Week: 0 days    Minutes of Exercise per Session: 0 min  Stress: No Stress Concern Present (05/28/2023)   [employer] of Occupational Health - Occupational Stress Questionnaire    Feeling of Stress : Not at all  Social Connections: Socially Isolated (05/28/2023)   Social Connection and Isolation Panel [NHANES]    Frequency of Communication with Friends and Family: More than three times a week    Frequency of  Social Gatherings with Friends and Family: More than three times a week    Attends Religious Services: Never    Database administrator or Organizations: No    Attends Banker Meetings: Never    Marital Status: Divorced  Catering manager Violence: Not At Risk (05/28/2023)   Humiliation, Afraid, Rape, and Kick questionnaire    Fear of Current or Ex-Partner: No    Emotionally Abused: No    Physically Abused: No    Sexually Abused: No    Outpatient Medications Prior to Visit  Medication Sig Dispense Refill   naproxen  (NAPROSYN ) 500 MG tablet TAKE ONE TABLET (500 MG TOTAL) BY MOUTH TWICE DAILY WITH A MEAL 60 tablet 11   OTEZLA  30 MG TABS TAKE ONE TABLET BY MOUTH TWICE DAILY 60 tablet 2   umeclidinium-vilanterol (ANORO ELLIPTA ) 62.5-25 MCG/ACT AEPB  Inhale 1 puff into the lungs daily. 1 each 11   Vitamin D , Ergocalciferol , (DRISDOL ) 1.25 MG (50000 UNIT) CAPS capsule Take 1 capsule (50,000 Units total) by mouth every 7 (seven) days. 20 capsule 3   betamethasone  dipropionate 0.05 % cream Apply topically 2 (two) times daily. 30 g 0   colchicine  0.6 MG tablet TAKE ONE TABLET BY MOUTH THREE TIMES DAILY FOR 5 DAYS FOR GOUT PAIN 15 tablet 3   hydrocortisone  cream 1 % Apply to affected area 2 times daily 15 g 0   losartan  (COZAAR ) 50 MG tablet Take 1 tablet (50 mg total) by mouth daily. 90 tablet 1   mupirocin  ointment (BACTROBAN ) 2 % Apply 1 Application topically 2 (two) times daily. 22 g 0   pantoprazole  (PROTONIX ) 40 MG tablet Take 1 tablet (40 mg total) by mouth daily. 90 tablet 1   rosuvastatin  (CRESTOR ) 20 MG tablet Take 1 tablet (20 mg total) by mouth daily. 90 tablet 1   sildenafil  (VIAGRA ) 100 MG tablet Take 1 tablet (100 mg total) by mouth as needed for erectile dysfunction. Take 1 hour prior to sexual activity: do not take >1 dose per day: 10 tablet 2   silver  sulfADIAZINE  (SILVADENE ) 1 % cream Apply 1 Application topically daily. 50 g 0   allopurinol  (ZYLOPRIM ) 300 MG tablet Take 1 tablet (300 mg total) by mouth daily. (Patient not taking: Reported on 05/28/2023) 90 tablet 1   predniSONE  (DELTASONE ) 50 MG tablet Take 50 mg by mouth daily with breakfast.     UNABLE TO FIND Blood pressure cuff x 1  DX I10 1 each 0   albuterol  (VENTOLIN  HFA) 108 (90 Base) MCG/ACT inhaler INHALE 1 TO 2 PUFFS INTO LUNGS EVERY 4 HOURS AS NEEDED FOR WHEEZING OR SHORTNESS OF BREATH 18 g 2   promethazine -dextromethorphan  (PROMETHAZINE -DM) 6.25-15 MG/5ML syrup Take 5 mLs by mouth 4 (four) times daily as needed. (Patient not taking: Reported on 05/27/2023) 118 mL 0   No facility-administered medications prior to visit.    No Known Allergies  ROS Review of Systems  Constitutional:  Negative for fatigue and fever.  HENT:  Positive for postnasal drip.   Eyes:   Negative for visual disturbance.  Respiratory:  Negative for chest tightness and shortness of breath.   Cardiovascular:  Negative for chest pain and palpitations.  Musculoskeletal:        Left knee pain  Neurological:  Negative for dizziness and headaches.      Objective:    Physical Exam HENT:     Head: Normocephalic.     Right Ear: External ear normal.     Left  Ear: External ear normal.     Nose: No congestion or rhinorrhea.     Mouth/Throat:     Mouth: Mucous membranes are moist.  Cardiovascular:     Rate and Rhythm: Regular rhythm.     Heart sounds: No murmur heard. Pulmonary:     Effort: No respiratory distress.     Breath sounds: Normal breath sounds.  Musculoskeletal:     Right knee: No deformity or effusion. Normal range of motion.     Left knee: No swelling, deformity or effusion. Normal range of motion.  Neurological:     Mental Status: He is alert.     BP (!) 147/78   Pulse 70   Ht 5\' 5"  (1.651 m)   Wt 195 lb 0.6 oz (88.5 kg)   SpO2 97%   BMI 32.46 kg/m  Wt Readings from Last 3 Encounters:  05/28/23 193 lb (87.5 kg)  05/27/23 195 lb 0.6 oz (88.5 kg)  01/21/23 185 lb 1.9 oz (84 kg)    Lab Results  Component Value Date   TSH 1.700 10/22/2022   Lab Results  Component Value Date   WBC 6.2 10/22/2022   HGB 13.4 10/22/2022   HCT 43.6 10/22/2022   MCV 85 10/22/2022   PLT 434 10/22/2022   Lab Results  Component Value Date   NA 139 10/22/2022   K 5.2 10/22/2022   CO2 19 (L) 10/22/2022   GLUCOSE 77 10/22/2022   BUN 30 (H) 10/22/2022   CREATININE 1.17 10/22/2022   BILITOT 0.3 10/22/2022   ALKPHOS 143 (H) 10/22/2022   AST 53 (H) 10/22/2022   ALT 42 10/22/2022   PROT 7.9 10/22/2022   ALBUMIN 4.3 10/22/2022   CALCIUM  9.3 10/22/2022   ANIONGAP 11 09/12/2022   EGFR 69 10/22/2022   Lab Results  Component Value Date   CHOL 171 10/22/2022   Lab Results  Component Value Date   HDL 68 10/22/2022   Lab Results  Component Value Date   LDLCALC  28 10/22/2022   Lab Results  Component Value Date   TRIG 559 (HH) 10/22/2022   Lab Results  Component Value Date   CHOLHDL 2.5 10/22/2022   Lab Results  Component Value Date   HGBA1C 5.1 10/22/2022      Assessment & Plan:  Acute pain of left knee Assessment & Plan: Will obtain imaging study to rule out structural abnormalities. Encouraged application of heat and cold therapy, rest, activity modification, and avoidance of aggravating activities. Advised the use of topical Voltaren gel to the affected site. Will consider referral to physical therapy and orthopedic surgery if symptoms worsen or fail to improve.    Orders: -     DG Knee Complete 4 Views Left  Primary hypertension Assessment & Plan: Controlled Encouraged to continue taking losartan  50 mg daily Low-sodium diet with increase physical activity encouraged Patient verbalized understanding is aware of plan of care BP Readings from Last 3 Encounters:  05/27/23 (!) 147/78  01/21/23 108/76  10/22/22 (!) 144/98     Orders: -     Losartan  Potassium; Take 1 tablet (50 mg total) by mouth daily.  Dispense: 90 tablet; Refill: 1  Other hyperlipidemia Assessment & Plan: Encouraged a heart healthy diet with increased physical activity Encouraged to continue taking rosuvastatin  20 mg daily Lab Results  Component Value Date   CHOL 171 10/22/2022   HDL 68 10/22/2022   LDLCALC 28 10/22/2022   TRIG 559 (HH) 10/22/2022   CHOLHDL 2.5 10/22/2022  Orders: -     Lipid panel -     CMP14+EGFR -     CBC with Differential/Platelet -     Rosuvastatin  Calcium ; Take 1 tablet (20 mg total) by mouth daily.  Dispense: 90 tablet; Refill: 1  Seasonal allergic rhinitis due to pollen Assessment & Plan: Will initiate therapy on xyzal  5 mg at bedtime  Orders: -     Levocetirizine Dihydrochloride ; Take 1 tablet (5 mg total) by mouth every evening.  Dispense: 30 tablet; Refill: 1  Other male erectile dysfunction -      Sildenafil  Citrate; Take 1 tablet (100 mg total) by mouth as needed for erectile dysfunction. Take 1 hour prior to sexual activity: do not take >1 dose per day:  Dispense: 10 tablet; Refill: 2  Psoriasis -     Hydrocortisone ; Apply to affected area 2 times daily  Dispense: 15 g; Refill: 0 -     Silver  sulfADIAZINE ; Apply 1 Application topically daily.  Dispense: 50 g; Refill: 0  Gastroesophageal reflux disease without esophagitis -     Pantoprazole  Sodium; Take 1 tablet (40 mg total) by mouth daily.  Dispense: 90 tablet; Refill: 1  IFG (impaired fasting glucose) -     Hemoglobin A1c  Vitamin D  deficiency -     VITAMIN D  25 Hydroxy (Vit-D Deficiency, Fractures)  TSH (thyroid-stimulating hormone deficiency) -     TSH + free T4  Mild intermittent asthma without complication -     Albuterol  Sulfate HFA; Inhale 1-2 puffs into the lungs every 4 (four) hours as needed for wheezing or shortness of breath.  Dispense: 18 g; Refill: 2  Chronic eczema of hand -     Betamethasone  Dipropionate; Apply topically 2 (two) times daily.  Dispense: 30 g; Refill: 0 -     Mupirocin ; Apply 1 Application topically 2 (two) times daily.  Dispense: 22 g; Refill: 0  Acute gout of multiple sites, unspecified cause -     Colchicine ; TAKE ONE TABLET BY MOUTH THREE TIMES DAILY FOR 5 DAYS FOR GOUT PAIN  Dispense: 15 tablet; Refill: 3  Other cough -     Promethazine -DM; Take 5 mLs by mouth 4 (four) times daily as needed.  Dispense: 118 mL; Refill: 0  Note: This chart has been completed using Engineer, civil (consulting) software, and while attempts have been made to ensure accuracy, certain words and phrases may not be transcribed as intended.    Follow-up: Return in about 4 months (around 09/26/2023).   Kirt Chew, FNP

## 2023-05-27 NOTE — Patient Instructions (Addendum)
 I appreciate the opportunity to provide care to you today!    Follow up:  4 months  Labs: please stop by the lab today to get your blood drawn (CBC, CMP, TSH, Lipid profile, HgA1c, Vit D)  Please stop by [facility] to get an x-ray of the left knee     Please continue to a heart-healthy diet and increase your physical activities. Try to exercise for at least five days a week.    It was a pleasure to see you and I look forward to continuing to work together on your health and well-being. Please do not hesitate to call the office if you need care or have questions about your care.  In case of emergency, please visit the Emergency Department for urgent care, or contact our clinic at [phone] to schedule an appointment. We're here to help you!   Have a wonderful day and week. With Gratitude, Eamon Tantillo MSN, FNP-BC

## 2023-05-28 ENCOUNTER — Ambulatory Visit (INDEPENDENT_AMBULATORY_CARE_PROVIDER_SITE_OTHER): Payer: 59

## 2023-05-28 VITALS — Ht 65.0 in | Wt 193.0 lb

## 2023-05-28 DIAGNOSIS — Z0100 Encounter for examination of eyes and vision without abnormal findings: Secondary | ICD-10-CM

## 2023-05-28 DIAGNOSIS — Z5320 Procedure and treatment not carried out because of patient's decision for unspecified reasons: Secondary | ICD-10-CM

## 2023-05-28 DIAGNOSIS — Z0000 Encounter for general adult medical examination without abnormal findings: Secondary | ICD-10-CM

## 2023-05-28 NOTE — Progress Notes (Signed)
 Please attest and cosign this visit due to patients primary care provider not being in the office at the time the visit was completed.  Because this visit was a virtual/telehealth visit,  certain criteria was not obtained, such a blood pressure, CBG if applicable, and timed get up and go. Any medications not marked as "taking" were not mentioned during the medication reconciliation part of the visit. Any vitals not documented were not able to be obtained due to this being a telehealth visit or patient was unable to self-report a recent blood pressure reading due to a lack of equipment at home via telehealth. Vitals that have been documented are verbally provided by the patient.   Subjective:   Jeffrey Krause is a 68 y.o. who presents for a Medicare Wellness preventive visit.  Visit Complete: Virtual I connected with  Brenda Calkin on 05/28/23 by a audio enabled telemedicine application and verified that I am speaking with the correct person using two identifiers.  Patient Location: Home  Provider Location: Home Office  I discussed the limitations of evaluation and management by telemedicine. The patient expressed understanding and agreed to proceed.  Vital Signs: Because this visit was a virtual/telehealth visit, some criteria may be missing or patient reported. Any vitals not documented were not able to be obtained and vitals that have been documented are patient reported.  VideoDeclined- This patient declined Librarian, academic. Therefore the visit was completed with audio only.  Persons Participating in Visit: Patient.  AWV Questionnaire: No: Patient Medicare AWV questionnaire was not completed prior to this visit.  Cardiac Risk Factors include: advanced age (>56men, >52 women);hypertension;male gender;obesity (BMI >30kg/m2);smoking/ tobacco exposure     Objective:    Today's Vitals   05/28/23 0914 05/28/23 0915  Weight: 193 lb (87.5 kg)   Height:  5\' 5"  (1.651 m)   PainSc:  7    Body mass index is 32.12 kg/m.     05/28/2023    9:24 AM 09/12/2022   10:39 AM 11/27/2021    5:33 AM 01/07/2021    8:18 AM 01/07/2021    5:17 AM 08/16/2014    7:21 AM  Advanced Directives  Does Patient Have a Medical Advance Directive? No No No No No No  Would patient like information on creating a medical advance directive? No - Patient declined No - Patient declined  No - Patient declined No - Patient declined     Current Medications (verified) Outpatient Encounter Medications as of 05/28/2023  Medication Sig   albuterol  (VENTOLIN  HFA) 108 (90 Base) MCG/ACT inhaler Inhale 1-2 puffs into the lungs every 4 (four) hours as needed for wheezing or shortness of breath.   betamethasone  dipropionate 0.05 % cream Apply topically 2 (two) times daily.   colchicine  0.6 MG tablet TAKE ONE TABLET BY MOUTH THREE TIMES DAILY FOR 5 DAYS FOR GOUT PAIN   hydrocortisone  cream 1 % Apply to affected area 2 times daily   levocetirizine (XYZAL ) 5 MG tablet Take 1 tablet (5 mg total) by mouth every evening.   losartan  (COZAAR ) 50 MG tablet Take 1 tablet (50 mg total) by mouth daily.   mupirocin  ointment (BACTROBAN ) 2 % Apply 1 Application topically 2 (two) times daily.   naproxen  (NAPROSYN ) 500 MG tablet TAKE ONE TABLET (500 MG TOTAL) BY MOUTH TWICE DAILY WITH A MEAL   OTEZLA  30 MG TABS TAKE ONE TABLET BY MOUTH TWICE DAILY   pantoprazole  (PROTONIX ) 40 MG tablet Take 1 tablet (40 mg total)  by mouth daily.   predniSONE  (DELTASONE ) 50 MG tablet Take 50 mg by mouth daily with breakfast.   promethazine -dextromethorphan  (PROMETHAZINE -DM) 6.25-15 MG/5ML syrup Take 5 mLs by mouth 4 (four) times daily as needed.   rosuvastatin  (CRESTOR ) 20 MG tablet Take 1 tablet (20 mg total) by mouth daily.   sildenafil  (VIAGRA ) 100 MG tablet Take 1 tablet (100 mg total) by mouth as needed for erectile dysfunction. Take 1 hour prior to sexual activity: do not take >1 dose per day:   silver  sulfADIAZINE   (SILVADENE ) 1 % cream Apply 1 Application topically daily.   umeclidinium-vilanterol (ANORO ELLIPTA ) 62.5-25 MCG/ACT AEPB Inhale 1 puff into the lungs daily.   UNABLE TO FIND Blood pressure cuff x 1  DX I10   Vitamin D , Ergocalciferol , (DRISDOL ) 1.25 MG (50000 UNIT) CAPS capsule Take 1 capsule (50,000 Units total) by mouth every 7 (seven) days.   allopurinol  (ZYLOPRIM ) 300 MG tablet Take 1 tablet (300 mg total) by mouth daily. (Patient not taking: Reported on 05/28/2023)   No facility-administered encounter medications on file as of 05/28/2023.    Allergies (verified) Patient has no known allergies.   History: Past Medical History:  Diagnosis Date   Chronic pain    COPD (chronic obstructive pulmonary disease) (HCC)    HTN (hypertension)    Psoriasis    Tobacco use    History reviewed. No pertinent surgical history. Family History  Problem Relation Age of Onset   Diabetes type II Father    Social History   Socioeconomic History   Marital status: Single    Spouse name: Not on file   Number of children: 15   Years of education: Not on file   Highest education level: Not on file  Occupational History   Not on file  Tobacco Use   Smoking status: Every Day    Current packs/day: 0.25    Average packs/day: 0.3 packs/day for 42.0 years (10.5 ttl pk-yrs)    Types: Cigarettes   Smokeless tobacco: Not on file  Vaping Use   Vaping status: Never Used  Substance and Sexual Activity   Alcohol use: Yes    Alcohol/week: 2.0 standard drinks of alcohol    Types: 2 Shots of liquor per week    Comment: a day   Drug use: No   Sexual activity: Yes    Birth control/protection: None  Other Topics Concern   Not on file  Social History Narrative   Not on file   Social Drivers of Health   Financial Resource Strain: Low Risk  (05/28/2023)   Overall Financial Resource Strain (CARDIA)    Difficulty of Paying Living Expenses: Not hard at all  Food Insecurity: No Food Insecurity (05/28/2023)    Hunger Vital Sign    Worried About Running Out of Food in the Last Year: Never true    Ran Out of Food in the Last Year: Never true  Transportation Needs: No Transportation Needs (05/28/2023)   PRAPARE - Administrator, Civil Service (Medical): No    Lack of Transportation (Non-Medical): No  Physical Activity: Inactive (05/28/2023)   Exercise Vital Sign    Days of Exercise per Week: 0 days    Minutes of Exercise per Session: 0 min  Stress: No Stress Concern Present (05/28/2023)   [employer] of Occupational Health - Occupational Stress Questionnaire    Feeling of Stress : Not at all  Social Connections: Socially Isolated (05/28/2023)   Social Connection and Isolation Panel [NHANES]  Frequency of Communication with Friends and Family: More than three times a week    Frequency of Social Gatherings with Friends and Family: More than three times a week    Attends Religious Services: Never    Database administrator or Organizations: No    Attends Engineer, structural: Never    Marital Status: Divorced    Tobacco Counseling Ready to quit: No Counseling given: Yes    Clinical Intake:  Pre-visit preparation completed: Yes  Pain : 0-10 Pain Score: 7  Pain Type: Acute pain Pain Location: Knee Pain Orientation: Left Pain Descriptors / Indicators: Constant, Aching Pain Onset: 1 to 4 weeks ago Pain Frequency: Constant     BMI - recorded: 32.12 Nutritional Status: BMI > 30  Obese Nutritional Risks: None Diabetes: No  Lab Results  Component Value Date   HGBA1C 5.1 10/22/2022   HGBA1C 5.9 (H) 05/21/2022   HGBA1C 5.6 12/25/2021     How often do you need to have someone help you when you read instructions, pamphlets, or other written materials from your doctor or pharmacy?: 1 - Never  Interpreter Needed?: No  Information entered by :: Sally Crazier CMA   Activities of Daily Living     05/28/2023    9:22 AM  In your present state of health, do you  have any difficulty performing the following activities:  Hearing? 0  Vision? 0  Difficulty concentrating or making decisions? 0  Walking or climbing stairs? 0  Dressing or bathing? 0  Doing errands, shopping? 0  Preparing Food and eating ? N  Using the Toilet? N  In the past six months, have you accidently leaked urine? N  Do you have problems with loss of bowel control? N  Managing your Medications? N  Managing your Finances? N  Housekeeping or managing your Housekeeping? N    Patient Care Team: Zarwolo, Gloria, FNP as PCP - General (Family Medicine)  Indicate any recent Medical Services you may have received from other than [facility] providers in the past year (date may be approximate).     Assessment:   This is a routine wellness examination for Brenten.  Hearing/Vision screen Hearing Screening - Comments:: Patient denies any hearing difficulties.   Vision Screening - Comments:: Patient does not have an eye doctor. Referral to ophthalmology placed.     Goals Addressed             This Visit's Progress    Patient Stated       I want to go fishing more.        Depression Screen     05/28/2023    9:36 AM 05/27/2023    9:48 AM 01/21/2023    9:41 AM 07/23/2022    9:11 AM 06/27/2022    9:31 AM 05/21/2022    8:18 AM 12/25/2021    8:12 AM  PHQ 2/9 Scores  PHQ - 2 Score 0 4 0 0 0 4 3  PHQ- 9 Score 0 6 0 1 6 8 8     Fall Risk     05/28/2023    9:26 AM 05/27/2023    9:48 AM 01/21/2023    9:41 AM 07/23/2022    9:11 AM 06/27/2022    9:31 AM  Fall Risk   Falls in the past year? 0 0 0 0 0  Number falls in past yr: 0 0 0 0 0  Injury with Fall? 0 0 0 0 0  Risk for fall due to :  No Fall Risks No Fall Risks No Fall Risks No Fall Risks No Fall Risks  Follow up Falls prevention discussed;Falls evaluation completed;Education provided Falls evaluation completed Falls evaluation completed Falls evaluation completed Falls evaluation completed    MEDICARE RISK AT HOME:  Medicare  Risk at Home Any stairs in or around the home?: No If so, are there any without handrails?: No Home free of loose throw rugs in walkways, pet beds, electrical cords, etc?: Yes Adequate lighting in your home to reduce risk of falls?: Yes Life alert?: No Use of a cane, walker or w/c?: No Grab bars in the bathroom?: No Shower chair or bench in shower?: No Elevated toilet seat or a handicapped toilet?: No  TIMED UP AND GO:  Was the test performed?  No  Cognitive Function: 6CIT completed        05/28/2023    9:33 AM  6CIT Screen  What Year? 0 points  What month? 0 points  What time? 0 points  Count back from 20 0 points  Months in reverse 0 points  Repeat phrase 0 points  Total Score 0 points    Immunizations Immunization History  Administered Date(s) Administered   Tdap 05/21/2022    Screening Tests Health Maintenance  Topic Date Due   Zoster Vaccines- Shingrix (1 of 2) 08/26/2023 (Originally 09/08/2007)   Pneumonia Vaccine 29+ Years old (1 of 2 - PCV) 05/26/2024 (Originally 09/07/1976)   Colonoscopy  05/26/2024 (Originally 09/08/2002)   INFLUENZA VACCINE  09/06/2023   Medicare Annual Wellness (AWV)  05/27/2024   DTaP/Tdap/Td (2 - Td or Tdap) 05/20/2032   Hepatitis C Screening  Completed   HIV Screening  Completed   HPV VACCINES  Aged Out   Meningococcal B Vaccine  Aged Out   COVID-19 Vaccine  Discontinued    Health Maintenance  There are no preventive care reminders to display for this patient. Health Maintenance Items Addressed:   Additional Screening:  Vision Screening: Recommended annual ophthalmology exams for early detection of glaucoma and other disorders of the eye.  Dental Screening: Recommended annual dental exams for proper oral hygiene  Community Resource Referral / Chronic Care Management: CRR required this visit?  No   CCM required this visit?  No     Plan:     I have personally reviewed and noted the following in the patient's chart:    Medical and social history Use of alcohol, tobacco or illicit drugs  Current medications and supplements including opioid prescriptions. Patient is not currently taking opioid prescriptions. Functional ability and status Nutritional status Physical activity Advanced directives List of other physicians Hospitalizations, surgeries, and ER visits in previous 12 months Vitals Screenings to include cognitive, depression, and falls Referrals and appointments  In addition, I have reviewed and discussed with patient certain preventive protocols, quality metrics, and best practice recommendations. A written personalized care plan for preventive services as well as general preventive health recommendations were provided to patient.     Abby Jams Trickett, CMA   05/28/2023   After Visit Summary: (Mail) Due to this being a telephonic visit, the after visit summary with patients personalized plan was offered to patient via mail   Notes: Nothing significant to report at this time.

## 2023-05-28 NOTE — Addendum Note (Signed)
 Addended by: Abigail Hoff A on: 05/28/2023 04:27 PM   Modules accepted: Level of Service

## 2023-05-28 NOTE — Progress Notes (Signed)
 Visit completed, however, will be a no charge, due to visit being too soon for a AWVI. Still in Welcome to Medicare period.   Please attest and cosign this visit due to patients primary care provider not being in the office at the time the visit was completed.  Because this visit was a virtual/telehealth visit,  certain criteria was not obtained, such a blood pressure, CBG if applicable, and timed get up and go. Any medications not marked as "taking" were not mentioned during the medication reconciliation part of the visit. Any vitals not documented were not able to be obtained due to this being a telehealth visit or patient was unable to self-report a recent blood pressure reading due to a lack of equipment at home via telehealth. Vitals that have been documented are verbally provided by the patient.   Subjective:   Jeffrey Krause is a 68 y.o. who presents for a Medicare Wellness preventive visit.  Visit Complete: Virtual I connected with  Brenda Calkin on 05/28/23 by a audio enabled telemedicine application and verified that I am speaking with the correct person using two identifiers.  Patient Location: Home  Provider Location: Home Office  I discussed the limitations of evaluation and management by telemedicine. The patient expressed understanding and agreed to proceed.  Vital Signs: Because this visit was a virtual/telehealth visit, some criteria may be missing or patient reported. Any vitals not documented were not able to be obtained and vitals that have been documented are patient reported.  VideoDeclined- This patient declined Librarian, academic. Therefore the visit was completed with audio only.  Persons Participating in Visit: Patient.  AWV Questionnaire: No: Patient Medicare AWV questionnaire was not completed prior to this visit.  Cardiac Risk Factors include: advanced age (>67men, >19 women);hypertension;male gender;obesity (BMI  >30kg/m2);smoking/ tobacco exposure     Objective:    Today's Vitals   05/28/23 0914 05/28/23 0915  Weight: 193 lb (87.5 kg)   Height: 5\' 5"  (1.651 m)   PainSc:  7    Body mass index is 32.12 kg/m.     05/28/2023    9:24 AM 09/12/2022   10:39 AM 11/27/2021    5:33 AM 01/07/2021    8:18 AM 01/07/2021    5:17 AM 08/16/2014    7:21 AM  Advanced Directives  Does Patient Have a Medical Advance Directive? No No No No No No  Would patient like information on creating a medical advance directive? No - Patient declined No - Patient declined  No - Patient declined No - Patient declined     Current Medications (verified) Outpatient Encounter Medications as of 05/28/2023  Medication Sig   albuterol  (VENTOLIN  HFA) 108 (90 Base) MCG/ACT inhaler Inhale 1-2 puffs into the lungs every 4 (four) hours as needed for wheezing or shortness of breath.   betamethasone  dipropionate 0.05 % cream Apply topically 2 (two) times daily.   colchicine  0.6 MG tablet TAKE ONE TABLET BY MOUTH THREE TIMES DAILY FOR 5 DAYS FOR GOUT PAIN   hydrocortisone  cream 1 % Apply to affected area 2 times daily   levocetirizine (XYZAL ) 5 MG tablet Take 1 tablet (5 mg total) by mouth every evening.   losartan  (COZAAR ) 50 MG tablet Take 1 tablet (50 mg total) by mouth daily.   mupirocin  ointment (BACTROBAN ) 2 % Apply 1 Application topically 2 (two) times daily.   naproxen  (NAPROSYN ) 500 MG tablet TAKE ONE TABLET (500 MG TOTAL) BY MOUTH TWICE DAILY WITH A MEAL  OTEZLA  30 MG TABS TAKE ONE TABLET BY MOUTH TWICE DAILY   pantoprazole  (PROTONIX ) 40 MG tablet Take 1 tablet (40 mg total) by mouth daily.   predniSONE  (DELTASONE ) 50 MG tablet Take 50 mg by mouth daily with breakfast.   promethazine -dextromethorphan  (PROMETHAZINE -DM) 6.25-15 MG/5ML syrup Take 5 mLs by mouth 4 (four) times daily as needed.   rosuvastatin  (CRESTOR ) 20 MG tablet Take 1 tablet (20 mg total) by mouth daily.   sildenafil  (VIAGRA ) 100 MG tablet Take 1 tablet (100  mg total) by mouth as needed for erectile dysfunction. Take 1 hour prior to sexual activity: do not take >1 dose per day:   silver  sulfADIAZINE  (SILVADENE ) 1 % cream Apply 1 Application topically daily.   umeclidinium-vilanterol (ANORO ELLIPTA ) 62.5-25 MCG/ACT AEPB Inhale 1 puff into the lungs daily.   UNABLE TO FIND Blood pressure cuff x 1  DX I10   Vitamin D , Ergocalciferol , (DRISDOL ) 1.25 MG (50000 UNIT) CAPS capsule Take 1 capsule (50,000 Units total) by mouth every 7 (seven) days.   allopurinol  (ZYLOPRIM ) 300 MG tablet Take 1 tablet (300 mg total) by mouth daily. (Patient not taking: Reported on 05/28/2023)   No facility-administered encounter medications on file as of 05/28/2023.    Allergies (verified) Patient has no known allergies.   History: Past Medical History:  Diagnosis Date   Chronic pain    COPD (chronic obstructive pulmonary disease) (HCC)    HTN (hypertension)    Psoriasis    Tobacco use    History reviewed. No pertinent surgical history. Family History  Problem Relation Age of Onset   Diabetes type II Father    Social History   Socioeconomic History   Marital status: Single    Spouse name: Not on file   Number of children: 15   Years of education: Not on file   Highest education level: Not on file  Occupational History   Not on file  Tobacco Use   Smoking status: Every Day    Current packs/day: 0.25    Average packs/day: 0.3 packs/day for 42.0 years (10.5 ttl pk-yrs)    Types: Cigarettes   Smokeless tobacco: Not on file  Vaping Use   Vaping status: Never Used  Substance and Sexual Activity   Alcohol use: Yes    Alcohol/week: 2.0 standard drinks of alcohol    Types: 2 Shots of liquor per week    Comment: a day   Drug use: No   Sexual activity: Yes    Birth control/protection: None  Other Topics Concern   Not on file  Social History Narrative   Not on file   Social Drivers of Health   Financial Resource Strain: Low Risk  (05/28/2023)    Overall Financial Resource Strain (CARDIA)    Difficulty of Paying Living Expenses: Not hard at all  Food Insecurity: No Food Insecurity (05/28/2023)   Hunger Vital Sign    Worried About Running Out of Food in the Last Year: Never true    Ran Out of Food in the Last Year: Never true  Transportation Needs: No Transportation Needs (05/28/2023)   PRAPARE - Administrator, Civil Service (Medical): No    Lack of Transportation (Non-Medical): No  Physical Activity: Inactive (05/28/2023)   Exercise Vital Sign    Days of Exercise per Week: 0 days    Minutes of Exercise per Session: 0 min  Stress: No Stress Concern Present (05/28/2023)   [employer] of Occupational Health - Occupational Stress Questionnaire  Feeling of Stress : Not at all  Social Connections: Socially Isolated (05/28/2023)   Social Connection and Isolation Panel [NHANES]    Frequency of Communication with Friends and Family: More than three times a week    Frequency of Social Gatherings with Friends and Family: More than three times a week    Attends Religious Services: Never    Database administrator or Organizations: No    Attends Engineer, structural: Never    Marital Status: Divorced    Tobacco Counseling Ready to quit: No Counseling given: Yes    Clinical Intake:  Pre-visit preparation completed: Yes  Pain : 0-10 Pain Score: 7  Pain Type: Acute pain Pain Location: Knee Pain Orientation: Left Pain Descriptors / Indicators: Constant, Aching Pain Onset: 1 to 4 weeks ago Pain Frequency: Constant     BMI - recorded: 32.12 Nutritional Status: BMI > 30  Obese Nutritional Risks: None Diabetes: No  Lab Results  Component Value Date   HGBA1C 5.1 10/22/2022   HGBA1C 5.9 (H) 05/21/2022   HGBA1C 5.6 12/25/2021     How often do you need to have someone help you when you read instructions, pamphlets, or other written materials from your doctor or pharmacy?: 1 - Never  Interpreter  Needed?: No  Information entered by :: Sally Crazier CMA   Activities of Daily Living     05/28/2023    9:22 AM  In your present state of health, do you have any difficulty performing the following activities:  Hearing? 0  Vision? 0  Difficulty concentrating or making decisions? 0  Walking or climbing stairs? 0  Dressing or bathing? 0  Doing errands, shopping? 0  Preparing Food and eating ? N  Using the Toilet? N  In the past six months, have you accidently leaked urine? N  Do you have problems with loss of bowel control? N  Managing your Medications? N  Managing your Finances? N  Housekeeping or managing your Housekeeping? N    Patient Care Team: Zarwolo, Gloria, FNP as PCP - General (Family Medicine)  Indicate any recent Medical Services you may have received from other than [facility] providers in the past year (date may be approximate).     Assessment:   This is a routine wellness examination for Jaecob.  Hearing/Vision screen Hearing Screening - Comments:: Patient denies any hearing difficulties.   Vision Screening - Comments:: Patient does not have an eye doctor. Referral to ophthalmology placed.     Goals Addressed             This Visit's Progress    Patient Stated       I want to go fishing more.        Depression Screen     05/28/2023    9:36 AM 05/27/2023    9:48 AM 01/21/2023    9:41 AM 07/23/2022    9:11 AM 06/27/2022    9:31 AM 05/21/2022    8:18 AM 12/25/2021    8:12 AM  PHQ 2/9 Scores  PHQ - 2 Score 0 4 0 0 0 4 3  PHQ- 9 Score 0 6 0 1 6 8 8     Fall Risk     05/28/2023    9:26 AM 05/27/2023    9:48 AM 01/21/2023    9:41 AM 07/23/2022    9:11 AM 06/27/2022    9:31 AM  Fall Risk   Falls in the past year? 0 0 0 0 0  Number  falls in past yr: 0 0 0 0 0  Injury with Fall? 0 0 0 0 0  Risk for fall due to : No Fall Risks No Fall Risks No Fall Risks No Fall Risks No Fall Risks  Follow up Falls prevention discussed;Falls evaluation completed;Education  provided Falls evaluation completed Falls evaluation completed Falls evaluation completed Falls evaluation completed    MEDICARE RISK AT HOME:  Medicare Risk at Home Any stairs in or around the home?: No If so, are there any without handrails?: No Home free of loose throw rugs in walkways, pet beds, electrical cords, etc?: Yes Adequate lighting in your home to reduce risk of falls?: Yes Life alert?: No Use of a cane, walker or w/c?: No Grab bars in the bathroom?: No Shower chair or bench in shower?: No Elevated toilet seat or a handicapped toilet?: No  TIMED UP AND GO:  Was the test performed?  No  Cognitive Function: 6CIT completed        05/28/2023    9:33 AM  6CIT Screen  What Year? 0 points  What month? 0 points  What time? 0 points  Count back from 20 0 points  Months in reverse 0 points  Repeat phrase 0 points  Total Score 0 points    Immunizations Immunization History  Administered Date(s) Administered   Tdap 05/21/2022    Screening Tests Health Maintenance  Topic Date Due   Zoster Vaccines- Shingrix (1 of 2) 08/26/2023 (Originally 09/08/2007)   Pneumonia Vaccine 67+ Years old (1 of 2 - PCV) 05/26/2024 (Originally 09/07/1976)   Colonoscopy  05/26/2024 (Originally 09/08/2002)   INFLUENZA VACCINE  09/06/2023   Medicare Annual Wellness (AWV)  05/27/2024   DTaP/Tdap/Td (2 - Td or Tdap) 05/20/2032   Hepatitis C Screening  Completed   HIV Screening  Completed   HPV VACCINES  Aged Out   Meningococcal B Vaccine  Aged Out   COVID-19 Vaccine  Discontinued    Health Maintenance  There are no preventive care reminders to display for this patient. Health Maintenance Items Addressed:   Additional Screening:  Vision Screening: Recommended annual ophthalmology exams for early detection of glaucoma and other disorders of the eye.  Dental Screening: Recommended annual dental exams for proper oral hygiene  Community Resource Referral / Chronic Care Management: CRR  required this visit?  No   CCM required this visit?  No     Plan:     I have personally reviewed and noted the following in the patient's chart:   Medical and social history Use of alcohol, tobacco or illicit drugs  Current medications and supplements including opioid prescriptions. Patient is not currently taking opioid prescriptions. Functional ability and status Nutritional status Physical activity Advanced directives List of other physicians Hospitalizations, surgeries, and ER visits in previous 12 months Vitals Screenings to include cognitive, depression, and falls Referrals and appointments  In addition, I have reviewed and discussed with patient certain preventive protocols, quality metrics, and best practice recommendations. A written personalized care plan for preventive services as well as general preventive health recommendations were provided to patient.     Abby Antoinette Haskett, CMA   05/28/2023   After Visit Summary: (Mail) Due to this being a telephonic visit, the after visit summary with patients personalized plan was offered to patient via mail   Notes: Visit completed, however, will be a no charge, due to visit being too soon for a AWVI. Still in Welcome to Medicare period.

## 2023-05-28 NOTE — Patient Instructions (Signed)
 Mr. Anastasia , Thank you for taking time to come for your Medicare Wellness Visit. I appreciate your ongoing commitment to your health goals. Please review the following plan we discussed and let me know if I can assist you in the future.   Referrals/Orders/Follow-Ups/Clinician Recommendations:  Next Medicare AWV: June 01, 2024 at 9:20 am telephone visit  You've been referred to [facility]. You can call them to schedule your appointment Address: [address], [place], [place] [address] Phone: [phone]  This is a list of the screening recommended for you and due dates:  Health Maintenance  Topic Date Due   Zoster (Shingles) Vaccine (1 of 2) 08/26/2023*   Pneumonia Vaccine (1 of 2 - PCV) 05/26/2024*   Colon Cancer Screening  05/26/2024*   Flu Shot  09/06/2023   Medicare Annual Wellness Visit  05/27/2024   DTaP/Tdap/Td vaccine (2 - Td or Tdap) 05/20/2032   Hepatitis C Screening  Completed   HIV Screening  Completed   HPV Vaccine  Aged Out   Meningitis B Vaccine  Aged Out   COVID-19 Vaccine  Discontinued  *Topic was postponed. The date shown is not the original due date.    Advanced directives: (Declined) Advance directive discussed with you today. Even though you declined this today, please call our office should you change your mind, and we can give you the proper paperwork for you to fill out. Advance Care Planning is important because it:  [x]  Makes sure you receive the medical care that is consistent with your values, goals, and preferences  [x]  It provides guidance to your family and loved ones and it also reduces their decisional burden about whether or not they are making the right decisions based on what you want done  Follow the link provided in your after visit summary or read over the paperwork we have mailed to you to help you started getting your Advance Directives in place. If you need assistance in completing these, please reach out to us  so that  we can help you!   Next Medicare Annual Wellness Visit scheduled for next year: yes  Understanding Your Risk for Falls Millions of people have serious injuries from falls each year. It is important to understand your risk of falling. Talk with your health care provider about your risk and what you can do to lower it. If you do have a serious fall, make sure to tell your provider. Falling once raises your risk of falling again. How can falls affect me? Serious injuries from falls are common. These include: Broken bones, such as hip fractures. Head injuries, such as traumatic brain injuries (TBI) or concussions. A fear of falling can cause you to avoid activities and stay at home. This can make your muscles weaker and raise your risk for a fall. What can increase my risk? There are a number of risk factors that increase your risk for falling. The more risk factors you have, the higher your risk of falling. Serious injuries from a fall happen most often to people who are older than 66 years old. Teenagers and young adults ages 28-29 are also at higher risk. Common risk factors include: Weakness in the lower body. Being generally weak or confused due to long-term (chronic) illness. Dizziness or balance problems. Poor vision. Medicines that cause dizziness or drowsiness. These may include: Medicines for your blood pressure, heart, anxiety, insomnia, or swelling (edema). Pain medicines. Muscle relaxants. Other risk factors include: Drinking alcohol. Having had a  fall in the past. Having foot pain or wearing improper footwear. Working at a dangerous job. Having any of the following in your home: Tripping hazards, such as floor clutter or loose rugs. Poor lighting. Pets. Having dementia or memory loss. What actions can I take to lower my risk of falling?     Physical activity Stay physically fit. Do strength and balance exercises. Consider taking a regular class to build strength and  balance. Yoga and tai chi are good options. Vision Have your eyes checked every year and your prescription for glasses or contacts updated as needed. Shoes and walking aids Wear non-skid shoes. Wear shoes that have rubber soles and low heels. Do not wear high heels. Do not walk around the house in socks or slippers. Use a cane or walker as told by your provider. Home safety Attach secure railings on both sides of your stairs. Install grab bars for your bathtub, shower, and toilet. Use a non-skid mat in your bathtub or shower. Attach bath mats securely with double-sided, non-slip rug tape. Use good lighting in all rooms. Keep a flashlight near your bed. Make sure there is a clear path from your bed to the bathroom. Use night-lights. Do not use throw rugs. Make sure all carpeting is taped or tacked down securely. Remove all clutter from walkways and stairways, including extension cords. Repair uneven or broken steps and floors. Avoid walking on icy or slippery surfaces. Walk on the grass instead of on icy or slick sidewalks. Use ice melter to get rid of ice on walkways in the winter. Use a cordless phone. Questions to ask your health care provider Can you help me check my risk for a fall? Do any of my medicines make me more likely to fall? Should I take a vitamin D  supplement? What exercises can I do to improve my strength and balance? Should I make an appointment to have my vision checked? Do I need a bone density test to check for weak bones (osteoporosis)? Would it help to use a cane or a walker? Where to find more information Centers for Disease Control and Prevention, STEADI: TonerPromos.no Community-Based Fall Prevention Programs: TonerPromos.no [employer] on Aging: BaseRingTones.pl Contact a health care provider if: You fall at home. You are afraid of falling at home. You feel weak, drowsy, or dizzy. This information is not intended to replace advice given to you by your health care  provider. Make sure you discuss any questions you have with your health care provider. Document Revised: 09/25/2021 Document Reviewed: 09/25/2021 Elsevier Patient Education  2024 Elsevier Inc.  Steps to Quit Smoking Smoking tobacco is the leading cause of preventable death. It can affect almost every organ in the body. Smoking puts you and people around you at risk for many serious, long-lasting (chronic) diseases. Quitting smoking can be hard, but it is one of the best things that you can do for your health. It is never too late to quit. Do not give up if you cannot quit the first time. Some people need to try many times to quit. Do your best to stick to your quit plan, and talk with your doctor if you have any questions or concerns. How do I get ready to quit? Pick a date to quit. Set a date within the next 2 weeks to give you time to prepare. Write down the reasons why you are quitting. Keep this list in places where you will see it often. Tell your family, friends, and co-workers that  you are quitting. Their support is important. Talk with your doctor about the choices that may help you quit. Find out if your health insurance will pay for these treatments. Know the people, places, things, and activities that make you want to smoke (triggers). Avoid them. What first steps can I take to quit smoking? Throw away all cigarettes at home, at work, and in your car. Throw away the things that you use when you smoke, such as ashtrays and lighters. Clean your car. Empty the ashtray. Clean your home, including curtains and carpets. What can I do to help me quit smoking? Talk with your doctor about taking medicines and seeing a counselor. You are more likely to succeed when you do both. If you are pregnant or breastfeeding: Talk with your doctor about counseling or other ways to quit smoking. Do not take medicine to help you quit smoking unless your doctor tells you to. Quit right away Quit smoking  completely, instead of slowly cutting back on how much you smoke over a period of time. Stopping smoking right away may be more successful than slowly quitting. Go to counseling. In-person is best if this is an option. You are more likely to quit if you go to counseling sessions regularly. Take medicine You may take medicines to help you quit. Some medicines need a prescription, and some you can buy over-the-counter. Some medicines may contain a drug called nicotine  to replace the nicotine  in cigarettes. Medicines may: Help you stop having the desire to smoke (cravings). Help to stop the problems that come when you stop smoking (withdrawal symptoms). Your doctor may ask you to use: Nicotine  patches, gum, or lozenges. Nicotine  inhalers or sprays. Non-nicotine  medicine that you take by mouth. Find resources Find resources and other ways to help you quit smoking and remain smoke-free after you quit. They include: Online chats with a Veterinary surgeon. Phone quitlines. Printed Materials engineer. Support groups or group counseling. Text messaging programs. Mobile phone apps. Use apps on your mobile phone or tablet that can help you stick to your quit plan. Examples of free services include Quit Guide from the CDC and smokefree.gov  What can I do to make it easier to quit?  Talk to your family and friends. Ask them to support and encourage you. Call a phone quitline, such as 1-800-QUIT-NOW, reach out to support groups, or work with a Veterinary surgeon. Ask people who smoke to not smoke around you. Avoid places that make you want to smoke, such as: Bars. Parties. Smoke-break areas at work. Spend time with people who do not smoke. Lower the stress in your life. Stress can make you want to smoke. Try these things to lower stress: Getting regular exercise. Doing deep-breathing exercises. Doing yoga. Meditating. What benefits will I see if I quit smoking? Over time, you may have: A better sense of smell  and taste. Less coughing and sore throat. A slower heart rate. Lower blood pressure. Clearer skin. Better breathing. Fewer sick days. Summary Quitting smoking can be hard, but it is one of the best things that you can do for your health. Do not give up if you cannot quit the first time. Some people need to try many times to quit. When you decide to quit smoking, make a plan to help you succeed. Quit smoking right away, not slowly over a period of time. When you start quitting, get help and support to keep you smoke-free. This information is not intended to replace advice given to you by  your health care provider. Make sure you discuss any questions you have with your health care provider. Document Revised: 01/13/2021 Document Reviewed: 01/13/2021 Elsevier Patient Education  2024 [employer].

## 2023-05-31 DIAGNOSIS — J301 Allergic rhinitis due to pollen: Secondary | ICD-10-CM | POA: Insufficient documentation

## 2023-05-31 DIAGNOSIS — M25562 Pain in left knee: Secondary | ICD-10-CM | POA: Insufficient documentation

## 2023-05-31 NOTE — Assessment & Plan Note (Signed)
 Controlled Encouraged to continue taking losartan  50 mg daily Low-sodium diet with increase physical activity encouraged Patient verbalized understanding is aware of plan of care BP Readings from Last 3 Encounters:  05/27/23 (!) 147/78  01/21/23 108/76  10/22/22 (!) 144/98

## 2023-05-31 NOTE — Assessment & Plan Note (Signed)
 Will obtain imaging study to rule out structural abnormalities. Encouraged application of heat and cold therapy, rest, activity modification, and avoidance of aggravating activities. Advised the use of topical Voltaren gel to the affected site. Will consider referral to physical therapy and orthopedic surgery if symptoms worsen or fail to improve.

## 2023-05-31 NOTE — Assessment & Plan Note (Signed)
 Encouraged a heart healthy diet with increased physical activity Encouraged to continue taking rosuvastatin 20 mg daily Lab Results  Component Value Date   CHOL 171 10/22/2022   HDL 68 10/22/2022   LDLCALC 28 10/22/2022   TRIG 559 (HH) 10/22/2022   CHOLHDL 2.5 10/22/2022

## 2023-05-31 NOTE — Assessment & Plan Note (Signed)
 Will initiate therapy on xyzal  5 mg at bedtime

## 2023-06-28 ENCOUNTER — Other Ambulatory Visit: Payer: Self-pay | Admitting: Family Medicine

## 2023-06-28 DIAGNOSIS — L409 Psoriasis, unspecified: Secondary | ICD-10-CM

## 2023-07-26 ENCOUNTER — Other Ambulatory Visit: Payer: Self-pay | Admitting: Family Medicine

## 2023-07-26 DIAGNOSIS — M109 Gout, unspecified: Secondary | ICD-10-CM

## 2023-08-08 ENCOUNTER — Encounter: Admitting: Family Medicine

## 2023-08-27 ENCOUNTER — Other Ambulatory Visit: Payer: Self-pay | Admitting: Family Medicine

## 2023-08-27 DIAGNOSIS — J301 Allergic rhinitis due to pollen: Secondary | ICD-10-CM

## 2023-09-30 ENCOUNTER — Ambulatory Visit (INDEPENDENT_AMBULATORY_CARE_PROVIDER_SITE_OTHER): Admitting: Family Medicine

## 2023-09-30 ENCOUNTER — Encounter: Payer: Self-pay | Admitting: Family Medicine

## 2023-09-30 VITALS — BP 162/88 | HR 70 | Resp 16 | Ht 65.0 in | Wt 193.0 lb

## 2023-09-30 DIAGNOSIS — F17210 Nicotine dependence, cigarettes, uncomplicated: Secondary | ICD-10-CM | POA: Diagnosis not present

## 2023-09-30 DIAGNOSIS — R058 Other specified cough: Secondary | ICD-10-CM | POA: Diagnosis not present

## 2023-09-30 DIAGNOSIS — I10 Essential (primary) hypertension: Secondary | ICD-10-CM

## 2023-09-30 MED ORDER — AMLODIPINE-OLMESARTAN 5-40 MG PO TABS: 1.0000 | ORAL_TABLET | Freq: Every day | ORAL | 0 refills | Status: DC

## 2023-09-30 MED ORDER — AMLODIPINE-OLMESARTAN 10-40 MG PO TABS: 1.0000 | ORAL_TABLET | Freq: Every day | ORAL | 3 refills | Status: DC

## 2023-09-30 MED ORDER — PROMETHAZINE-DM 6.25-15 MG/5ML PO SYRP
5.0000 mL | ORAL_SOLUTION | Freq: Four times a day (QID) | ORAL | 0 refills | Status: AC | PRN
Start: 2023-09-30 — End: ?

## 2023-09-30 NOTE — Assessment & Plan Note (Signed)
 The patient is encouraged to stop by [facility] to get a chest x-ray Resume Promethazine  DM as previously prescribed May use guaifenesin  (Mucinex ) to help thin mucus if needed. Encourage increased oral fluid intake to help loosen mucus. Use a cool-mist humidifier at night to ease throat irritation and reduce cough. Warm fluids (tea with honey, broth) may provide symptomatic relief. Avoid smoking or secondhand smoke exposure as this can worsen cough. Limit exposure to allergens or irritants if identifiable. Monitor for red flag symptoms: fever, chest pain, worsening shortness of breath, blood in sputum, or cough lasting >8 weeks.

## 2023-09-30 NOTE — Assessment & Plan Note (Signed)
Smokes about 1 pack/day  Asked about quitting: confirms that he currently smokes cigarettes Advise to quit smoking: Educated about QUITTING to reduce the risk of cancer, cardio and cerebrovascular disease. Assess willingness: Unwilling to quit at this time, but is working on cutting back. Assist with counseling and pharmacotherapy: Counseled for 5 minutes and literature provided. Arrange for follow up: follow up in 3 months and continue to offer help.  

## 2023-09-30 NOTE — Assessment & Plan Note (Signed)
 Blood pressure is uncontrolled in clinic today. The patient remains asymptomatic. Current antihypertensive regimen will be discontinued. Initiate olmesartan -amlodipine  40/5 mg daily. Encourage home blood pressure monitoring and bring log to follow-up. Educated on signs/symptoms of hypotension or hypertensive urgency/emergency and instructed to seek care if they occur.  A low-sodium diet of less than 2,300 mg daily is recommended, along with moderate-intensity physical activity for at least 150 minutes per week. The patient is encouraged to maintain these lifestyle modifications to help manage her blood pressure effectively.  Long-term considerations were discussed, emphasizing that uncontrolled hypertension increases the risk of cardiovascular diseases, including stroke, coronary artery disease, and heart failure.  The patient is encouraged to seek emergency care if blood pressure exceeds 180/120 and is accompanied by symptoms such as headaches, chest pain, palpitations, blurred vision, or dizziness. She verbalized understanding and will follow up as scheduled.  BP Readings from Last 3 Encounters:  09/30/23 (!) 162/88  05/27/23 (!) 147/78  01/21/23 108/76

## 2023-09-30 NOTE — Progress Notes (Signed)
 Established Patient Office Visit  Subjective:  Patient ID: Jeffrey Krause, male    DOB: 02/17/1955  Age: 68 y.o. MRN: [id number]  CC:  Chief Complaint  Patient presents with   Cough    States when he stopped taking the promethazine  DM a couple months ago, his cough came back and he coughs up clear bubbly mucus.     HPI Jeffrey Krause is a 68 y.o. male with past medical history of tobacco use, primary hypertension, hyperlipidemia presents for f/u of chronic medical conditions.  Patient reports return of cough after discontinuing promethazine  DM a couple of months ago.Cough is productive of clear, bubbly mucus.No mention of fever, chest pain, shortness of breath, or other concerning symptoms.   BP Readings from Last 3 Encounters:  09/30/23 (!) 162/88  05/27/23 (!) 147/78  01/21/23 108/76      Past Medical History:  Diagnosis Date   Chronic pain    COPD (chronic obstructive pulmonary disease) (HCC)    HTN (hypertension)    Psoriasis    Tobacco use     No past surgical history on file.  Family History  Problem Relation Age of Onset   Diabetes type II Father     Social History   Socioeconomic History   Marital status: Single    Spouse name: Not on file   Number of children: 15   Years of education: Not on file   Highest education level: Not on file  Occupational History   Not on file  Tobacco Use   Smoking status: Every Day    Current packs/day: 0.25    Average packs/day: 0.3 packs/day for 42.0 years (10.5 ttl pk-yrs)    Types: Cigarettes   Smokeless tobacco: Not on file  Vaping Use   Vaping status: Never Used  Substance and Sexual Activity   Alcohol use: Yes    Alcohol/week: 2.0 standard drinks of alcohol    Types: 2 Shots of liquor per week    Comment: a day   Drug use: No   Sexual activity: Yes    Birth control/protection: None  Other Topics Concern   Not on file  Social History Narrative   Not on file   Social Drivers of Health   Financial  Resource Strain: Low Risk  (05/28/2023)   Overall Financial Resource Strain (CARDIA)    Difficulty of Paying Living Expenses: Not hard at all  Food Insecurity: No Food Insecurity (05/28/2023)   Hunger Vital Sign    Worried About Running Out of Food in the Last Year: Never true    Ran Out of Food in the Last Year: Never true  Transportation Needs: No Transportation Needs (05/28/2023)   PRAPARE - Administrator, Civil Service (Medical): No    Lack of Transportation (Non-Medical): No  Physical Activity: Inactive (05/28/2023)   Exercise Vital Sign    Days of Exercise per Week: 0 days    Minutes of Exercise per Session: 0 min  Stress: No Stress Concern Present (05/28/2023)   [employer] of Occupational Health - Occupational Stress Questionnaire    Feeling of Stress : Not at all  Social Connections: Socially Isolated (05/28/2023)   Social Connection and Isolation Panel    Frequency of Communication with Friends and Family: More than three times a week    Frequency of Social Gatherings with Friends and Family: More than three times a week    Attends Religious Services: Never    Production manager of [employer]  or Organizations: No    Attends Banker Meetings: Never    Marital Status: Divorced  Catering manager Violence: Not At Risk (05/28/2023)   Humiliation, Afraid, Rape, and Kick questionnaire    Fear of Current or Ex-Partner: No    Emotionally Abused: No    Physically Abused: No    Sexually Abused: No    Outpatient Medications Prior to Visit  Medication Sig Dispense Refill   albuterol  (VENTOLIN  HFA) 108 (90 Base) MCG/ACT inhaler Inhale 1-2 puffs into the lungs every 4 (four) hours as needed for wheezing or shortness of breath. 18 g 2   allopurinol  (ZYLOPRIM ) 300 MG tablet TAKE ONE TABLET 300 MG TOTAL BY MOUTH DAILY AT 9AM 90 tablet 11   betamethasone  dipropionate 0.05 % cream Apply topically 2 (two) times daily. 30 g 0   colchicine  0.6 MG tablet TAKE ONE TABLET BY  MOUTH THREE TIMES DAILY FOR 5 DAYS FOR GOUT PAIN 15 tablet 3   hydrocortisone  cream 1 % Apply to affected area 2 times daily 15 g 0   levocetirizine (XYZAL ) 5 MG tablet TAKE ONE TABLET (5MG  TOTAL) BY MOUTH DAILY AT 9PM IN THE EVENING 30 tablet 11   mupirocin  ointment (BACTROBAN ) 2 % Apply 1 Application topically 2 (two) times daily. 22 g 0   naproxen  (NAPROSYN ) 500 MG tablet TAKE ONE TABLET (500 MG TOTAL) BY MOUTH TWICE DAILY WITH A MEAL 60 tablet 11   OTEZLA  30 MG TABS TAKE ONE TABLET BY MOUTH TWICE DAILY @ 9AM & 5PM 60 tablet 11   pantoprazole  (PROTONIX ) 40 MG tablet Take 1 tablet (40 mg total) by mouth daily. 90 tablet 1   predniSONE  (DELTASONE ) 50 MG tablet Take 50 mg by mouth daily with breakfast.     rosuvastatin  (CRESTOR ) 20 MG tablet Take 1 tablet (20 mg total) by mouth daily. 90 tablet 1   sildenafil  (VIAGRA ) 100 MG tablet Take 1 tablet (100 mg total) by mouth as needed for erectile dysfunction. Take 1 hour prior to sexual activity: do not take >1 dose per day: 10 tablet 2   silver  sulfADIAZINE  (SILVADENE ) 1 % cream Apply 1 Application topically daily. 50 g 0   umeclidinium-vilanterol (ANORO ELLIPTA ) 62.5-25 MCG/ACT AEPB Inhale 1 puff into the lungs daily. 1 each 11   UNABLE TO FIND Blood pressure cuff x 1  DX I10 1 each 0   Vitamin D , Ergocalciferol , (DRISDOL ) 1.25 MG (50000 UNIT) CAPS capsule Take 1 capsule (50,000 Units total) by mouth every 7 (seven) days. 20 capsule 3   losartan  (COZAAR ) 50 MG tablet Take 1 tablet (50 mg total) by mouth daily. 90 tablet 1   promethazine -dextromethorphan  (PROMETHAZINE -DM) 6.25-15 MG/5ML syrup Take 5 mLs by mouth 4 (four) times daily as needed. (Patient not taking: Reported on 09/30/2023) 118 mL 0   No facility-administered medications prior to visit.    No Known Allergies  ROS Review of Systems  Constitutional:  Negative for fatigue and fever.  Eyes:  Negative for visual disturbance.  Respiratory:  Positive for cough. Negative for chest  tightness, shortness of breath and wheezing.   Cardiovascular:  Negative for chest pain and palpitations.  Neurological:  Negative for dizziness and headaches.      Objective:    Physical Exam HENT:     Head: Normocephalic.     Right Ear: External ear normal.     Left Ear: External ear normal.     Nose: No congestion or rhinorrhea.     Mouth/Throat:  Mouth: Mucous membranes are moist.  Cardiovascular:     Rate and Rhythm: Regular rhythm.     Heart sounds: No murmur heard. Pulmonary:     Effort: No respiratory distress.     Breath sounds: Normal breath sounds.  Neurological:     Mental Status: He is alert.     BP (!) 162/88   Pulse 70   Resp 16   Ht 5' 5 (1.651 m)   Wt 193 lb (87.5 kg)   SpO2 97%   BMI 32.12 kg/m  Wt Readings from Last 3 Encounters:  09/30/23 193 lb (87.5 kg)  05/28/23 193 lb (87.5 kg)  05/27/23 195 lb 0.6 oz (88.5 kg)    Lab Results  Component Value Date   TSH 1.700 10/22/2022   Lab Results  Component Value Date   WBC 6.2 10/22/2022   HGB 13.4 10/22/2022   HCT 43.6 10/22/2022   MCV 85 10/22/2022   PLT 434 10/22/2022   Lab Results  Component Value Date   NA 139 10/22/2022   K 5.2 10/22/2022   CO2 19 (L) 10/22/2022   GLUCOSE 77 10/22/2022   BUN 30 (H) 10/22/2022   CREATININE 1.17 10/22/2022   BILITOT 0.3 10/22/2022   ALKPHOS 143 (H) 10/22/2022   AST 53 (H) 10/22/2022   ALT 42 10/22/2022   PROT 7.9 10/22/2022   ALBUMIN 4.3 10/22/2022   CALCIUM  9.3 10/22/2022   ANIONGAP 11 09/12/2022   EGFR 69 10/22/2022   Lab Results  Component Value Date   CHOL 171 10/22/2022   Lab Results  Component Value Date   HDL 68 10/22/2022   Lab Results  Component Value Date   LDLCALC 28 10/22/2022   Lab Results  Component Value Date   TRIG 559 (HH) 10/22/2022   Lab Results  Component Value Date   CHOLHDL 2.5 10/22/2022   Lab Results  Component Value Date   HGBA1C 5.1 10/22/2022      Assessment & Plan:  Primary  hypertension Assessment & Plan: Blood pressure is uncontrolled in clinic today. The patient remains asymptomatic. Current antihypertensive regimen will be discontinued. Initiate olmesartan -amlodipine  40/5 mg daily. Encourage home blood pressure monitoring and bring log to follow-up. Educated on signs/symptoms of hypotension or hypertensive urgency/emergency and instructed to seek care if they occur.  A low-sodium diet of less than 2,300 mg daily is recommended, along with moderate-intensity physical activity for at least 150 minutes per week. The patient is encouraged to maintain these lifestyle modifications to help manage her blood pressure effectively.  Long-term considerations were discussed, emphasizing that uncontrolled hypertension increases the risk of cardiovascular diseases, including stroke, coronary artery disease, and heart failure.  The patient is encouraged to seek emergency care if blood pressure exceeds 180/120 and is accompanied by symptoms such as headaches, chest pain, palpitations, blurred vision, or dizziness. She verbalized understanding and will follow up as scheduled.  BP Readings from Last 3 Encounters:  09/30/23 (!) 162/88  05/27/23 (!) 147/78  01/21/23 108/76     Orders: -     amLODIPine -Olmesartan ; Take 1 tablet by mouth daily.  Dispense: 30 tablet; Refill: 0  Cigarette smoker Assessment & Plan: Smokes about 1 pack/day  Asked about quitting: confirms that he currently smokes cigarettes Advise to quit smoking: Educated about QUITTING to reduce the risk of cancer, cardio and cerebrovascular disease. Assess willingness: Unwilling to quit at this time, but is working on cutting back. Assist with counseling and pharmacotherapy: Counseled for 5 minutes and literature provided.  Arrange for follow up: follow up in 3 months and continue to offer help.    Other specified cough Assessment & Plan: The patient is encouraged to stop by [facility] to get a  chest x-ray Resume Promethazine  DM as previously prescribed May use guaifenesin  (Mucinex ) to help thin mucus if needed. Encourage increased oral fluid intake to help loosen mucus. Use a cool-mist humidifier at night to ease throat irritation and reduce cough. Warm fluids (tea with honey, broth) may provide symptomatic relief. Avoid smoking or secondhand smoke exposure as this can worsen cough. Limit exposure to allergens or irritants if identifiable. Monitor for red flag symptoms: fever, chest pain, worsening shortness of breath, blood in sputum, or cough lasting >8 weeks.  Orders: -     DG Chest 2 View -     Promethazine -DM; Take 5 mLs by mouth 4 (four) times daily as needed.  Dispense: 118 mL; Refill: 0  Note: This chart has been completed using Engineer, civil (consulting) software, and while attempts have been made to ensure accuracy, certain words and phrases may not be transcribed as intended.    Follow-up: Return in about 1 month (around 10/31/2023).   Jemila Camille, FNP

## 2023-09-30 NOTE — Patient Instructions (Addendum)
 I appreciate the opportunity to provide care to you today!    Follow up:  1 months  Labs: next visit  Cough Please stop by [facility] to get a chest x-ray Resume Promethazine  DM as previously prescribed May use guaifenesin  (Mucinex ) to help thin mucus if needed. Encourage increased oral fluid intake to help loosen mucus. Use a cool-mist humidifier at night to ease throat irritation and reduce cough. Warm fluids (tea with honey, broth) may provide symptomatic relief. Avoid smoking or secondhand smoke exposure as this can worsen cough. Limit exposure to allergens or irritants if identifiable. Monitor for red flag symptoms: fever, chest pain, worsening shortness of breath, blood in sputum, or cough lasting >8 weeks.  Hypertension Management  Your current blood pressure is above the target goal of <140/90 mmHg. To address this, please start taking olmesartan  -amlodipine  40-5 mg daily.  Medication Instructions: Take your blood pressure medication at the same time each day. After taking your medication, check your blood pressure at least an hour later. If your first reading is >140/90 mmHg, wait at least 10 minutes and recheck your blood pressure. Side Effects: In the initial days of therapy, you may experience dizziness or lightheadedness as your body adjusts to the lower blood pressure; this is expected. Diet and Lifestyle: Adhere to a low-sodium diet, limiting intake to less than 1500 mg daily, and increase your physical activity. Avoid over-the-counter NSAIDs such as ibuprofen  and naproxen  while on this medication. Hydration and Nutrition: Stay well-hydrated by drinking at least 64 ounces of water daily. Increase your servings of fruits and vegetables and avoid excessive sodium in your diet. Long-Term Considerations: Uncontrolled hypertension can increase the risk of cardiovascular diseases, including stroke, coronary artery disease, and heart failure.  Please report to the  emergency department if your blood pressure exceeds 180/120 and is accompanied by symptoms such as headaches, chest pain, palpitations, blurred vision, or dizziness.   Please follow up if your symptoms worsen or fail to improve.    Please continue to a heart-healthy diet and increase your physical activities. Try to exercise for at least five days a week.    It was a pleasure to see you and I look forward to continuing to work together on your health and well-being. Please do not hesitate to call the office if you need care or have questions about your care.  In case of emergency, please visit the Emergency Department for urgent care, or contact our clinic at [phone] to schedule an appointment. We're here to help you!   Have a wonderful day and week. With Gratitude, Keean Wilmeth MSN, FNP-BC

## 2023-10-02 ENCOUNTER — Other Ambulatory Visit: Payer: Self-pay | Admitting: Family Medicine

## 2023-10-02 DIAGNOSIS — L409 Psoriasis, unspecified: Secondary | ICD-10-CM

## 2023-10-28 ENCOUNTER — Ambulatory Visit: Admitting: Family Medicine

## 2023-11-18 ENCOUNTER — Other Ambulatory Visit: Payer: Self-pay | Admitting: Family Medicine

## 2023-11-18 DIAGNOSIS — I10 Essential (primary) hypertension: Secondary | ICD-10-CM

## 2023-11-18 DIAGNOSIS — K219 Gastro-esophageal reflux disease without esophagitis: Secondary | ICD-10-CM

## 2023-11-18 DIAGNOSIS — E7849 Other hyperlipidemia: Secondary | ICD-10-CM

## 2023-11-25 ENCOUNTER — Other Ambulatory Visit (HOSPITAL_COMMUNITY): Payer: Self-pay

## 2023-11-25 ENCOUNTER — Telehealth: Payer: Self-pay | Admitting: Pharmacy Technician

## 2023-11-25 NOTE — Telephone Encounter (Signed)
 Pharmacy Patient Advocate Encounter   Received notification from CoverMyMeds that prior authorization for Otezla  30MG  tablets is required/requested.   Insurance verification completed.   The patient is insured through [employer].   Per test claim: PA required; PA submitted to above mentioned insurance via Latent Key/confirmation #/EOC [facility] Status is pending

## 2023-11-25 NOTE — Telephone Encounter (Signed)
 Pharmacy Patient Advocate Encounter  Received notification from [facility] that Prior Authorization for Otezla  30MG  tablets has been APPROVED from 11/2023 to 02/04/2025. Ran test claim, Copay is $0.00. This test claim was processed through [facility]- copay amounts may vary at other pharmacies due to pharmacy/plan contracts, or as the patient moves through the different stages of their insurance plan.   PA #/Case ID/Reference #: [id number]

## 2023-11-27 ENCOUNTER — Telehealth: Payer: Self-pay | Admitting: Family Medicine

## 2023-11-27 NOTE — Telephone Encounter (Signed)
 Medication refill request - med not on active med list    Copied from CRM [id number]. Topic: Clinical - Medication Refill >> Nov 27, 2023  5:58 PM Zebedee SAUNDERS wrote: Medication: Losartan  50 mg   Has the patient contacted their pharmacy? Yes (Agent: If no, request that the patient contact the pharmacy for the refill. If patient does not wish to contact the pharmacy document the reason why and proceed with request.) (Agent: If yes, when and what did the pharmacy advise?)   This is the patient's preferred pharmacy:    [facility] - [place], [place] - [address] [place] [address] Phone: [phone] Fax: [phone]

## 2023-11-27 NOTE — Telephone Encounter (Signed)
 Copied from CRM [id number]. Topic: Clinical - Medication Refill >> Nov 27, 2023  5:58 PM Zebedee SAUNDERS wrote: Medication: Losartan  50 mg  Has the patient contacted their pharmacy? Yes (Agent: If no, request that the patient contact the pharmacy for the refill. If patient does not wish to contact the pharmacy document the reason why and proceed with request.) (Agent: If yes, when and what did the pharmacy advise?)  This is the patient's preferred pharmacy:   [facility] - [place] - [address] [place] [address] Phone: [phone] Fax: [phone]  Is this the correct pharmacy for this prescription? Yes If no, delete pharmacy and type the correct one.   Has the prescription been filled recently? No  Is the patient out of the medication? Yes  Has the patient been seen for an appointment in the last year OR does the patient have an upcoming appointment? Yes  Can we respond through MyChart? Yes  Agent: Please be advised that Rx refills may take up to 3 business days. We ask that you follow-up with your pharmacy.

## 2023-12-01 ENCOUNTER — Other Ambulatory Visit: Payer: Self-pay | Admitting: Family Medicine

## 2023-12-17 ENCOUNTER — Other Ambulatory Visit: Payer: Self-pay | Admitting: Family Medicine

## 2023-12-17 DIAGNOSIS — I10 Essential (primary) hypertension: Secondary | ICD-10-CM

## 2024-01-16 ENCOUNTER — Other Ambulatory Visit: Payer: Self-pay | Admitting: Family Medicine

## 2024-01-16 DIAGNOSIS — I10 Essential (primary) hypertension: Secondary | ICD-10-CM

## 2024-01-30 ENCOUNTER — Other Ambulatory Visit: Payer: Self-pay | Admitting: Family Medicine

## 2024-01-30 DIAGNOSIS — J449 Chronic obstructive pulmonary disease, unspecified: Secondary | ICD-10-CM

## 2024-02-03 ENCOUNTER — Other Ambulatory Visit: Payer: Self-pay | Admitting: Family Medicine

## 2024-02-03 DIAGNOSIS — E559 Vitamin D deficiency, unspecified: Secondary | ICD-10-CM

## 2024-02-10 DIAGNOSIS — N528 Other male erectile dysfunction: Secondary | ICD-10-CM

## 2024-02-14 ENCOUNTER — Other Ambulatory Visit: Payer: Self-pay | Admitting: Family Medicine

## 2024-02-14 DIAGNOSIS — I10 Essential (primary) hypertension: Secondary | ICD-10-CM

## 2024-02-21 ENCOUNTER — Ambulatory Visit: Admitting: Family Medicine

## 2024-02-24 ENCOUNTER — Other Ambulatory Visit: Payer: Self-pay | Admitting: Family Medicine

## 2024-02-24 DIAGNOSIS — M109 Gout, unspecified: Secondary | ICD-10-CM

## 2024-03-04 ENCOUNTER — Encounter: Payer: Self-pay | Admitting: Nurse Practitioner

## 2024-03-04 ENCOUNTER — Ambulatory Visit: Admitting: Nurse Practitioner

## 2024-03-04 VITALS — BP 130/90 | HR 77 | Ht 65.0 in | Wt 199.0 lb

## 2024-03-04 DIAGNOSIS — Z6832 Body mass index (BMI) 32.0-32.9, adult: Secondary | ICD-10-CM

## 2024-03-04 DIAGNOSIS — E782 Mixed hyperlipidemia: Secondary | ICD-10-CM | POA: Diagnosis not present

## 2024-03-04 DIAGNOSIS — M1000 Idiopathic gout, unspecified site: Secondary | ICD-10-CM | POA: Diagnosis not present

## 2024-03-04 DIAGNOSIS — F17210 Nicotine dependence, cigarettes, uncomplicated: Secondary | ICD-10-CM | POA: Diagnosis not present

## 2024-03-04 DIAGNOSIS — I10 Essential (primary) hypertension: Secondary | ICD-10-CM | POA: Diagnosis not present

## 2024-03-04 DIAGNOSIS — J449 Chronic obstructive pulmonary disease, unspecified: Secondary | ICD-10-CM

## 2024-03-04 DIAGNOSIS — L409 Psoriasis, unspecified: Secondary | ICD-10-CM | POA: Diagnosis not present

## 2024-03-04 DIAGNOSIS — K7460 Unspecified cirrhosis of liver: Secondary | ICD-10-CM

## 2024-03-04 DIAGNOSIS — K219 Gastro-esophageal reflux disease without esophagitis: Secondary | ICD-10-CM | POA: Diagnosis not present

## 2024-03-04 DIAGNOSIS — Z125 Encounter for screening for malignant neoplasm of prostate: Secondary | ICD-10-CM

## 2024-03-04 DIAGNOSIS — E876 Hypokalemia: Secondary | ICD-10-CM

## 2024-03-04 DIAGNOSIS — M109 Gout, unspecified: Secondary | ICD-10-CM

## 2024-03-04 MED ORDER — AMLODIPINE-OLMESARTAN 5-40 MG PO TABS: 1.0000 | ORAL_TABLET | Freq: Every day | ORAL | 1 refills | Status: AC

## 2024-03-04 MED ORDER — AMLODIPINE-OLMESARTAN 5-40 MG PO TABS: 1.0000 | ORAL_TABLET | Freq: Every day | ORAL | 1 refills | Status: DC

## 2024-03-04 MED ORDER — ALLOPURINOL 300 MG PO TABS: 300.0000 mg | ORAL_TABLET | Freq: Every day | ORAL | 1 refills | Status: DC

## 2024-03-04 MED ORDER — ALLOPURINOL 300 MG PO TABS: 300.0000 mg | ORAL_TABLET | Freq: Every day | ORAL | 1 refills | Status: AC

## 2024-03-04 NOTE — Patient Instructions (Signed)

## 2024-03-04 NOTE — Progress Notes (Signed)
 "  Subjective:    Patient ID: MONTAVIS SCHUBRING, male    DOB: [birth year], 69 y.o.   MRN: [id number]   Chief Complaint: Medical Management of Chronic Issues (One month follow up)    HPI:  JAYSHAWN COLSTON is a 69 y.o. who identifies as a male who was assigned male at birth.   Social history: Lives with: by hisself- family checks on him Work history: retired   Water Engineer in today for follow up of the following chronic medical issues:  1. Primary hypertension No c/o chest pain, sob or headache Does not check blood pressure at home Is on azor  daily BP Readings from Last 3 Encounters:  03/04/24 (!) 130/90  09/30/23 (!) 162/88  05/27/23 (!) 147/78     2. COPD GOLD ?  Uses anoro inhlaer daily Denies any SOB or wheezing   3. Gastroesophageal reflux disease without esophagitis Takes protonix  daily No recent flare ups of symptoms  4. Hepatic cirrhosis, unspecified hepatic cirrhosis type, unspecified whether ascites present [facility]) Has not seen GI recently Denies abdominal pain Drinks alcohol on occassion Lab Results  Component Value Date   ALT 42 10/22/2022   AST 53 (H) 10/22/2022   ALKPHOS 143 (H) 10/22/2022   BILITOT 0.3 10/22/2022     5. Mixed hyperlipidemia Does try to watch diet Does no dedicated exercise Lab Results  Component Value Date   CHOL 171 10/22/2022   HDL 68 10/22/2022   LDLCALC 28 10/22/2022   TRIG 559 (HH) 10/22/2022   CHOLHDL 2.5 10/22/2022     6. Hypokalemia No potassium supplements being taken Lab Results  Component Value Date   K 5.2 10/22/2022     7. Cigarette smoker Smokes about 1/2 pack a day  8. Acute idiopathic gout, unspecified site No recent out flare ups Is on allopurinol  daily  Takes colchicine  as needed  9. Psoriasis Is on otezla  See dermatology  10. BMI 32.0-32.9,adult No recent weight changes Wt Readings from Last 3 Encounters:  03/04/24 199 lb (90.3 kg)  09/30/23 193 lb (87.5 kg)  05/28/23 193 lb (87.5 kg)    BMI Readings from Last 3 Encounters:  03/04/24 33.12 kg/m  09/30/23 32.12 kg/m  05/28/23 32.12 kg/m      New complaints: None today  Allergies[1] Outpatient Encounter Medications as of 03/04/2024  Medication Sig   albuterol  (VENTOLIN  HFA) 108 (90 Base) MCG/ACT inhaler Inhale 1-2 puffs into the lungs every 4 (four) hours as needed for wheezing or shortness of breath.   allopurinol  (ZYLOPRIM ) 300 MG tablet TAKE ONE TABLET 300 MG TOTAL BY MOUTH DAILY AT 9AM   amLODipine -olmesartan  (AZOR ) 5-40 MG tablet Take 1 tablet by mouth daily.   ANORO ELLIPTA  62.5-25 MCG/ACT AEPB INHALE 1 PUFF INTO LUNGS BY MOUTH DAILY   betamethasone  dipropionate 0.05 % cream Apply topically 2 (two) times daily.   colchicine  0.6 MG tablet TAKE ONE TABLET BY MOUTH THREE TIMES DAILY FOR 5 DAYS FOR GOUT PAIN   hydrocortisone  cream 1 % Apply to affected area 2 times daily   levocetirizine (XYZAL ) 5 MG tablet TAKE ONE TABLET (5MG  TOTAL) BY MOUTH DAILY AT 9PM IN THE EVENING   mupirocin  ointment (BACTROBAN ) 2 % Apply 1 Application topically 2 (two) times daily.   naproxen  (NAPROSYN ) 500 MG tablet TAKE ONE TABLET (500 MG TOTAL) BY MOUTH TWICE DAILY @ 9AM & 5PM WITH A MEAL   OTEZLA  30 MG TABS TAKE ONE TABLET BY MOUTH TWICE DAILY @ 9AM & 5PM   pantoprazole  (  PROTONIX ) 40 MG tablet TAKE ONE TABLET (40 MG TOTAL) BY MOUTH DAILY AT 9AM   predniSONE  (DELTASONE ) 50 MG tablet Take 50 mg by mouth daily with breakfast.   promethazine -dextromethorphan  (PROMETHAZINE -DM) 6.25-15 MG/5ML syrup Take 5 mLs by mouth 4 (four) times daily as needed.   rosuvastatin  (CRESTOR ) 20 MG tablet TAKE ONE TABLET (20 MG TOTAL) BY MOUTH DAILY AT 5PM   sildenafil  (VIAGRA ) 100 MG tablet TAKE ONE TABLET (100 MG TOTAL) BY MOUTH AS NEEDED FOR ERECTILE DYSFUNCTION.TAKE 1 HOUR PRIOR TO SEXUAL ACTIVITY. DO NOT TAKE > 1 DOSE PER DAY   silver  sulfADIAZINE  (SILVADENE ) 1 % cream APPLY 1 APPLICATION TOPICALLY DAILY   UNABLE TO FIND Blood pressure cuff x 1  DX I10    Vitamin D , Ergocalciferol , (DRISDOL ) 1.25 MG (50000 UNIT) CAPS capsule Take 1 capsule (50,000 Units total) by mouth every 7 (seven) days.   No facility-administered encounter medications on file as of 03/04/2024.    No past surgical history on file.  Family History  Problem Relation Age of Onset   Diabetes type II Father       Controlled substance contract: n/a      Review of Systems  Constitutional:  Negative for diaphoresis.  Eyes:  Negative for pain.  Respiratory:  Negative for shortness of breath.   Cardiovascular:  Negative for chest pain, palpitations and leg swelling.  Gastrointestinal:  Negative for abdominal pain.  Endocrine: Negative for polydipsia.  Skin:  Negative for rash.  Neurological:  Negative for dizziness, weakness and headaches.  Hematological:  Does not bruise/bleed easily.  All other systems reviewed and are negative.      Objective:   Physical Exam Vitals and nursing note reviewed.  Constitutional:      Appearance: Normal appearance. He is well-developed.  HENT:     Head: Normocephalic.     Nose: Nose normal.     Mouth/Throat:     Mouth: Mucous membranes are moist.     Pharynx: Oropharynx is clear.  Eyes:     Pupils: Pupils are equal, round, and reactive to light.  Neck:     Thyroid: No thyroid mass or thyromegaly.     Vascular: No carotid bruit or JVD.     Trachea: Phonation normal.  Cardiovascular:     Rate and Rhythm: Normal rate and regular rhythm.  Pulmonary:     Effort: Pulmonary effort is normal. No respiratory distress.     Breath sounds: Normal breath sounds.  Abdominal:     General: Bowel sounds are normal.     Palpations: Abdomen is soft.     Tenderness: There is no abdominal tenderness.  Musculoskeletal:        General: Normal range of motion.     Cervical back: Normal range of motion and neck supple.  Lymphadenopathy:     Cervical: No cervical adenopathy.  Skin:    General: Skin is warm and dry.  Neurological:      Mental Status: He is alert and oriented to person, place, and time.  Psychiatric:        Behavior: Behavior normal.        Thought Content: Thought content normal.        Judgment: Judgment normal.    BP (!) 130/90   Pulse 77   Ht 5' 5 (1.651 m)   Wt 199 lb (90.3 kg)   SpO2 99%   BMI 33.12 kg/m         Assessment & Plan:  KAIAN FAHS comes in today with chief complaint of Medical Management of Chronic Issues (One month follow up)   Diagnosis and orders addressed:  1. Primary hypertension (Primary) Low sodium diet - amLODipine -olmesartan  (AZOR ) 5-40 MG tablet; Take 1 tablet by mouth daily.  Dispense: 90 tablet; Refill: 1 - CBC with Differential/Platelet - CMP14+EGFR  2. COPD GOLD ?  Continue anoro daily Report any wheezing or SOB  3. Gastroesophageal reflux disease without esophagitis Avoid spicy foods Do not eat 2 hours prior to bedtime   4. Hepatic cirrhosis, unspecified hepatic cirrhosis type, unspecified whether ascites present (HCC) Avoid alcohol Labs pending  5. Mixed hyperlipidemia Low fat diet - Lipid panel  6. Hypokalemia Labs pending  7. Cigarette smoker Smoking cessation encouraged  8. Acute idiopathic gout, unspecified site Continue allopurinol  daily Low purine diet  9. BMI 32.0-32.9,adult Discussed diet and exercise for person with BMI >25 Will recheck weight in 3-6 months   10. Psoriasis Keep follow up with dermatology  11. Acute gout of multiple sites, unspecified cause - allopurinol  (ZYLOPRIM ) 300 MG tablet; Take 1 tablet (300 mg total) by mouth daily.  Dispense: 90 tablet; Refill: 1  12. Prostate cancer screening Labs pending - PSA, total and free   Labs pending Health Maintenance reviewed Diet and exercise encouraged  Follow up plan: 6 months   Mary-Margaret Gladis, FNP     [1] No Known Allergies  "

## 2024-03-05 ENCOUNTER — Ambulatory Visit: Payer: Self-pay | Admitting: Nurse Practitioner

## 2024-03-05 LAB — CBC WITH DIFFERENTIAL/PLATELET
Basophils Absolute: 0 10*3/uL (ref 0.0–0.2)
Basos: 1 %
EOS (ABSOLUTE): 0.3 10*3/uL (ref 0.0–0.4)
Eos: 5 %
Hematocrit: 46.8 % (ref 37.5–51.0)
Hemoglobin: 14.9 g/dL (ref 13.0–17.7)
Immature Grans (Abs): 0 10*3/uL (ref 0.0–0.1)
Immature Granulocytes: 0 %
Lymphocytes Absolute: 2.1 10*3/uL (ref 0.7–3.1)
Lymphs: 37 %
MCH: 26.4 pg — ABNORMAL LOW (ref 26.6–33.0)
MCHC: 31.8 g/dL (ref 31.5–35.7)
MCV: 83 fL (ref 79–97)
Monocytes Absolute: 0.4 10*3/uL (ref 0.1–0.9)
Monocytes: 8 %
Neutrophils Absolute: 2.9 10*3/uL (ref 1.4–7.0)
Neutrophils: 49 %
Platelets: 301 10*3/uL (ref 150–450)
RBC: 5.65 x10E6/uL (ref 4.14–5.80)
RDW: 15.5 % — ABNORMAL HIGH (ref 11.6–15.4)
WBC: 5.8 10*3/uL (ref 3.4–10.8)

## 2024-03-05 LAB — CMP14+EGFR
ALT: 12 [IU]/L (ref 0–44)
AST: 14 [IU]/L (ref 0–40)
Albumin: 4.6 g/dL (ref 3.9–4.9)
Alkaline Phosphatase: 116 [IU]/L (ref 47–123)
BUN/Creatinine Ratio: 13 (ref 10–24)
BUN: 18 mg/dL (ref 8–27)
Bilirubin Total: 0.4 mg/dL (ref 0.0–1.2)
CO2: 18 mmol/L — ABNORMAL LOW (ref 20–29)
Calcium: 9.9 mg/dL (ref 8.6–10.2)
Chloride: 106 mmol/L (ref 96–106)
Creatinine, Ser: 1.4 mg/dL — ABNORMAL HIGH (ref 0.76–1.27)
Globulin, Total: 2.9 g/dL (ref 1.5–4.5)
Glucose: 86 mg/dL (ref 70–99)
Potassium: 4.1 mmol/L (ref 3.5–5.2)
Sodium: 140 mmol/L (ref 134–144)
Total Protein: 7.5 g/dL (ref 6.0–8.5)
eGFR: 55 mL/min/{1.73_m2} — ABNORMAL LOW

## 2024-03-05 LAB — PSA, TOTAL AND FREE
PSA, Free Pct: 34.3 %
PSA, Free: 0.72 ng/mL
Prostate Specific Ag, Serum: 2.1 ng/mL (ref 0.0–4.0)

## 2024-03-05 LAB — LIPID PANEL
Chol/HDL Ratio: 4.2 ratio (ref 0.0–5.0)
Cholesterol, Total: 174 mg/dL (ref 100–199)
HDL: 41 mg/dL
LDL Chol Calc (NIH): 112 mg/dL — ABNORMAL HIGH (ref 0–99)
Triglycerides: 115 mg/dL (ref 0–149)
VLDL Cholesterol Cal: 21 mg/dL (ref 5–40)

## 2024-06-01 ENCOUNTER — Ambulatory Visit

## 2024-09-02 ENCOUNTER — Ambulatory Visit: Payer: Self-pay | Admitting: Nurse Practitioner
